# Patient Record
Sex: Female | Born: 1965 | Race: White | Hispanic: No | Marital: Married | Smoking: Never smoker
Health system: Southern US, Community
[De-identification: ages and names within clinical notes are randomized; demographics above are authoritative.]

## PROBLEM LIST (undated history)

## (undated) HISTORY — PX: AUGMENTATION MAMMAPLASTY: SUR837

---

## 2008-11-30 ENCOUNTER — Ambulatory Visit: Payer: Self-pay

## 2010-02-15 ENCOUNTER — Ambulatory Visit: Payer: Self-pay

## 2011-03-04 ENCOUNTER — Ambulatory Visit: Payer: Self-pay

## 2012-03-23 ENCOUNTER — Ambulatory Visit: Payer: Self-pay

## 2013-03-28 ENCOUNTER — Ambulatory Visit: Payer: Self-pay

## 2014-04-04 ENCOUNTER — Ambulatory Visit: Payer: Self-pay

## 2014-05-31 ENCOUNTER — Ambulatory Visit: Payer: Self-pay | Admitting: Podiatry

## 2014-08-16 ENCOUNTER — Ambulatory Visit: Payer: Self-pay | Admitting: Podiatry

## 2016-04-24 ENCOUNTER — Other Ambulatory Visit: Payer: Self-pay | Admitting: Obstetrics and Gynecology

## 2016-04-24 DIAGNOSIS — Z1231 Encounter for screening mammogram for malignant neoplasm of breast: Secondary | ICD-10-CM

## 2016-05-14 ENCOUNTER — Other Ambulatory Visit: Payer: Self-pay | Admitting: Obstetrics and Gynecology

## 2016-05-14 ENCOUNTER — Ambulatory Visit
Admission: RE | Admit: 2016-05-14 | Discharge: 2016-05-14 | Disposition: A | Discharge status: Home / Self Care | Payer: 59 | Source: Ambulatory Visit | Attending: Obstetrics and Gynecology | Admitting: Obstetrics and Gynecology

## 2016-05-14 DIAGNOSIS — R928 Other abnormal and inconclusive findings on diagnostic imaging of breast: Secondary | ICD-10-CM | POA: Diagnosis not present

## 2016-05-14 DIAGNOSIS — Z1231 Encounter for screening mammogram for malignant neoplasm of breast: Secondary | ICD-10-CM

## 2016-05-20 ENCOUNTER — Other Ambulatory Visit: Payer: Self-pay | Admitting: Obstetrics and Gynecology

## 2016-05-20 DIAGNOSIS — N6489 Other specified disorders of breast: Secondary | ICD-10-CM

## 2016-06-06 ENCOUNTER — Other Ambulatory Visit: Payer: 59

## 2016-06-06 ENCOUNTER — Ambulatory Visit: Payer: 59

## 2016-06-20 ENCOUNTER — Other Ambulatory Visit: Payer: Self-pay | Admitting: Obstetrics and Gynecology

## 2016-06-20 ENCOUNTER — Ambulatory Visit
Admission: RE | Admit: 2016-06-20 | Discharge: 2016-06-20 | Disposition: A | Discharge status: Home / Self Care | Payer: 59 | Source: Ambulatory Visit | Attending: Obstetrics and Gynecology | Admitting: Obstetrics and Gynecology

## 2016-06-20 DIAGNOSIS — N6489 Other specified disorders of breast: Secondary | ICD-10-CM

## 2016-06-20 DIAGNOSIS — N63 Unspecified lump in breast: Secondary | ICD-10-CM | POA: Insufficient documentation

## 2016-06-25 ENCOUNTER — Other Ambulatory Visit: Payer: Self-pay | Admitting: Obstetrics and Gynecology

## 2016-06-25 DIAGNOSIS — N6489 Other specified disorders of breast: Secondary | ICD-10-CM

## 2016-12-22 ENCOUNTER — Ambulatory Visit
Admission: RE | Admit: 2016-12-22 | Discharge: 2016-12-22 | Disposition: A | Discharge status: Home / Self Care | Payer: 59 | Source: Ambulatory Visit | Attending: Obstetrics and Gynecology | Admitting: Obstetrics and Gynecology

## 2016-12-22 DIAGNOSIS — N6489 Other specified disorders of breast: Secondary | ICD-10-CM | POA: Diagnosis present

## 2016-12-25 ENCOUNTER — Other Ambulatory Visit: Payer: Self-pay | Admitting: Obstetrics and Gynecology

## 2016-12-25 DIAGNOSIS — N6320 Unspecified lump in the left breast, unspecified quadrant: Secondary | ICD-10-CM

## 2017-01-09 IMAGING — US US BREAST*L* LIMITED INC AXILLA
1 series · 13 of 13 positions shown · non-contrast
Comparison: Previous exam(s).

CLINICAL DATA: The patient was called back from screening
mammography due to a left breast asymmetry

EXAM:
2D DIGITAL DIAGNOSTIC LEFT MAMMOGRAM WITH ADJUNCT TOMO
ULTRASOUND LEFT BREAST

[Series 1: us breast*left* limited inc axilla · 0.06mm/px · 13 of 13 slices shown]
[im 1/13]
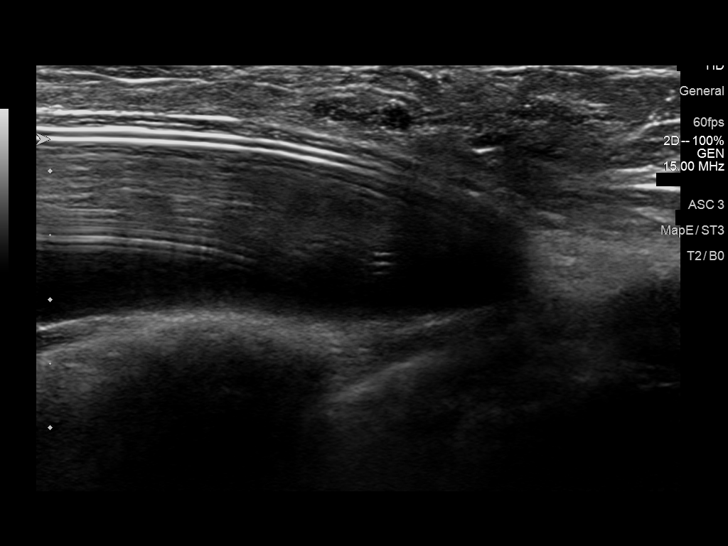
[im 2/13]
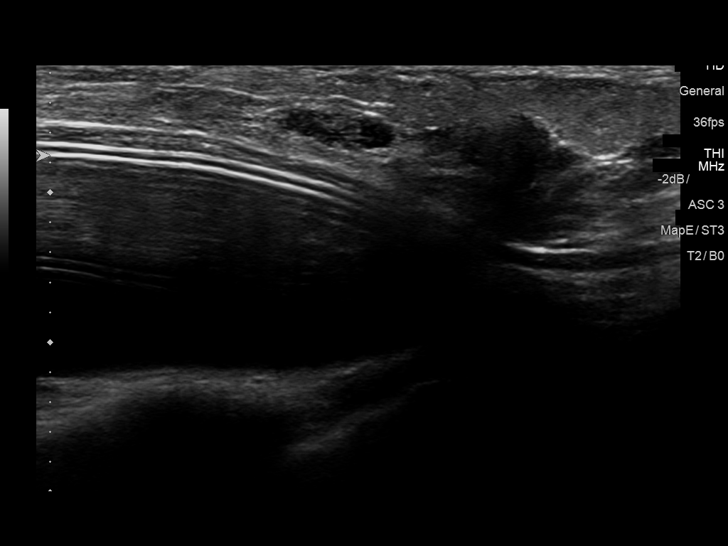
[im 3/13]
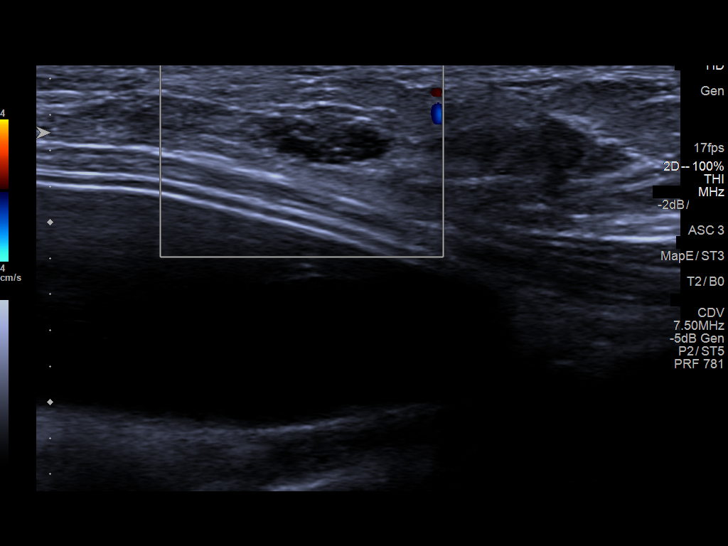
[im 4/13]
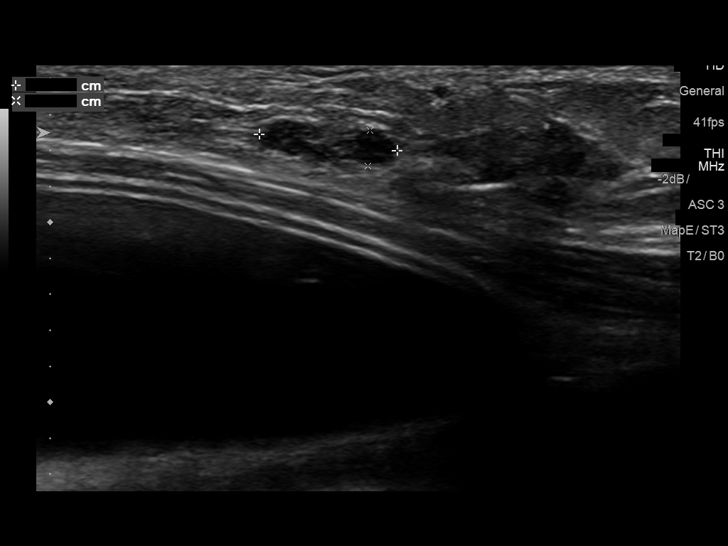
[im 5/13]
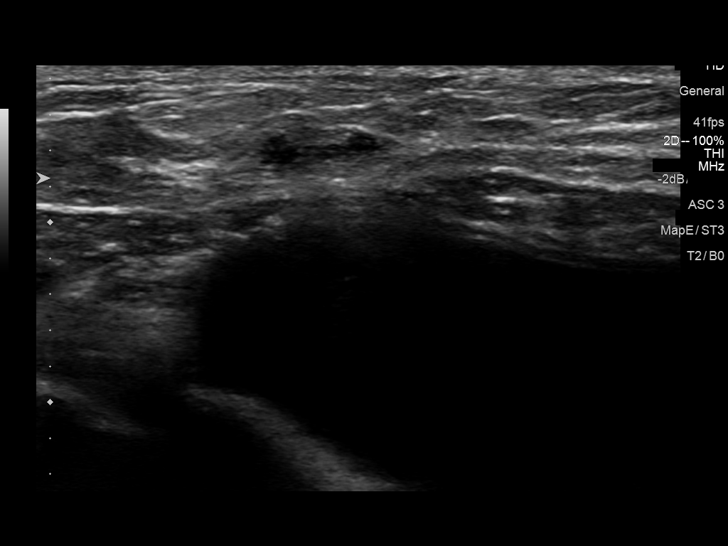
[im 6/13]
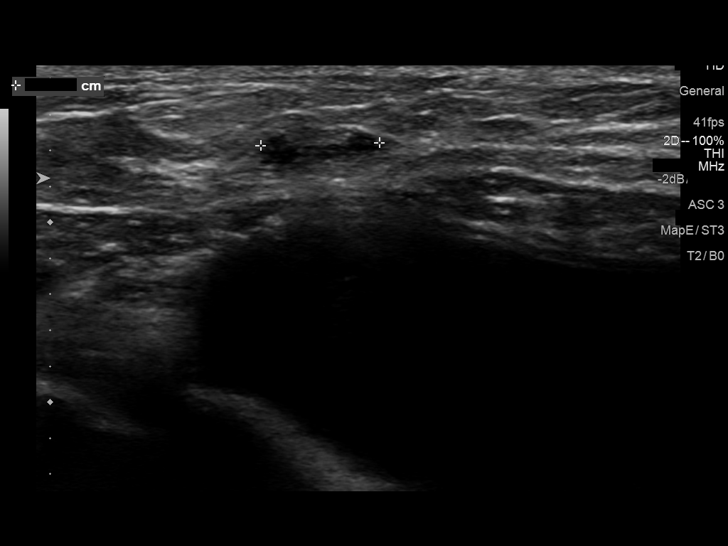
[im 7/13]
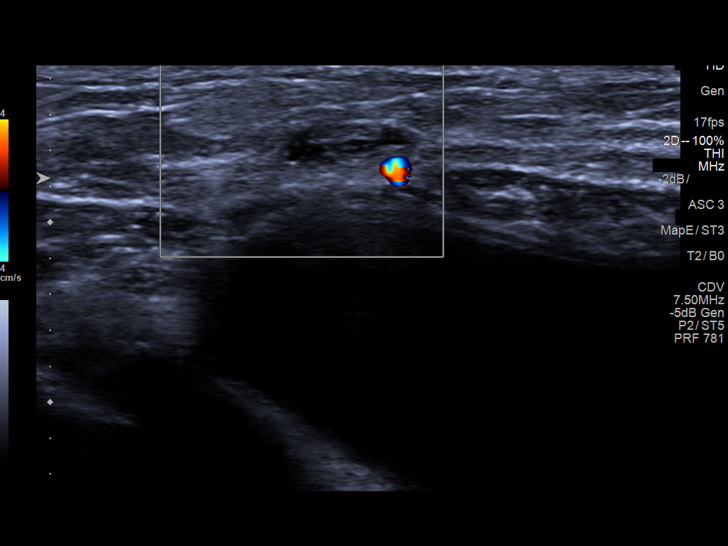
[im 8/13]
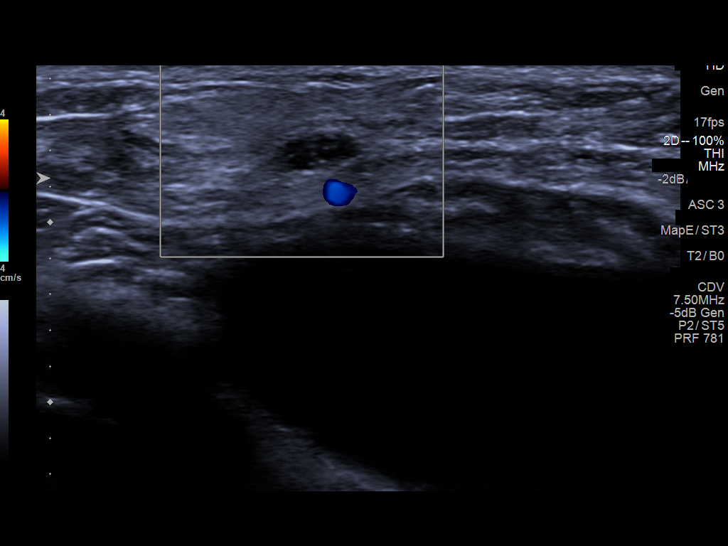
[im 9/13]
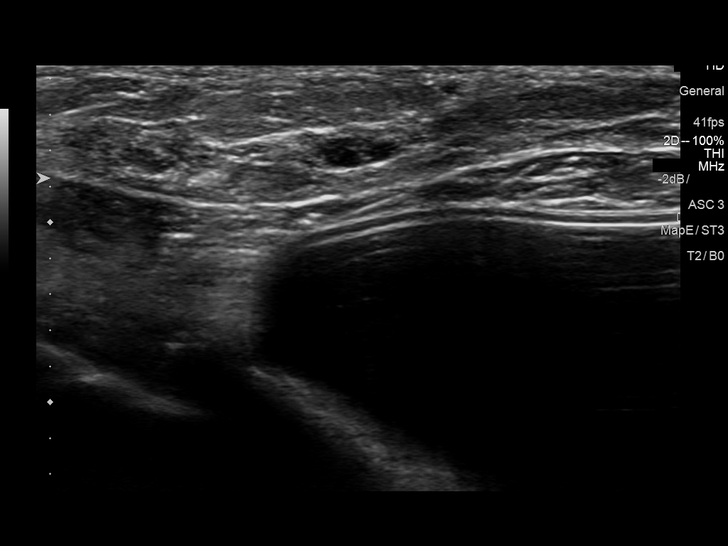
[im 10/13]
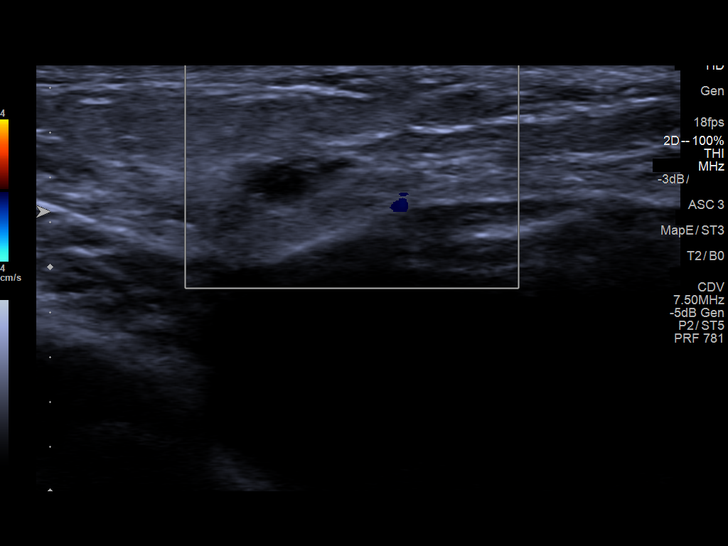
[im 11/13]
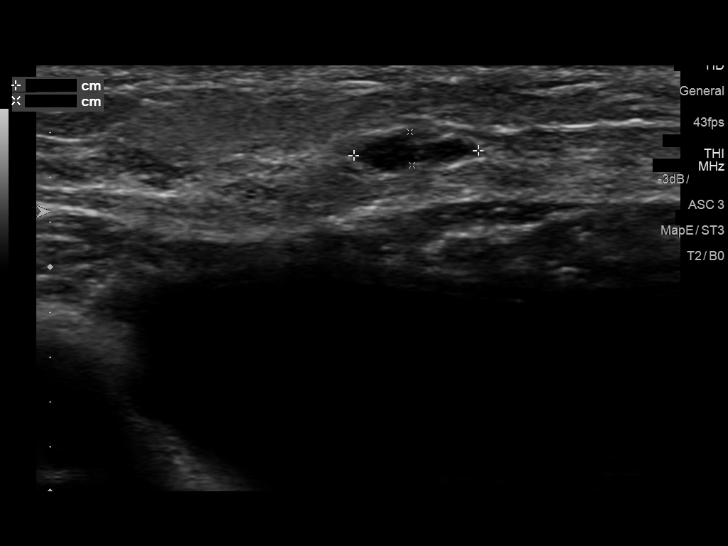
[im 12/13]
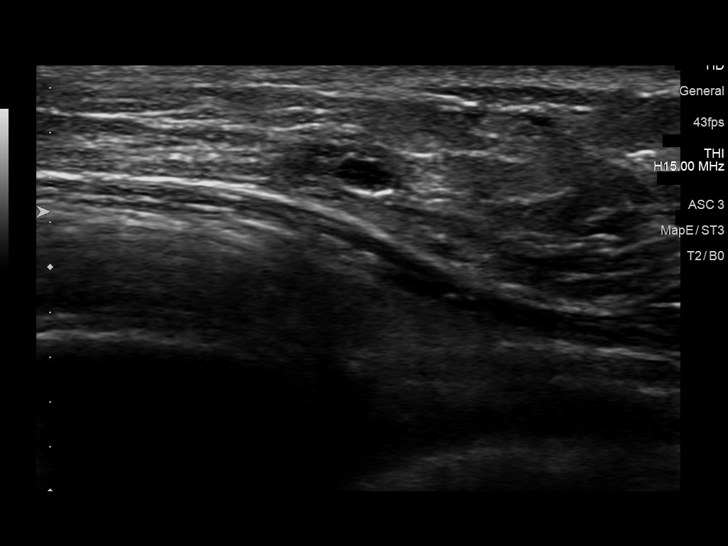
[im 13/13]
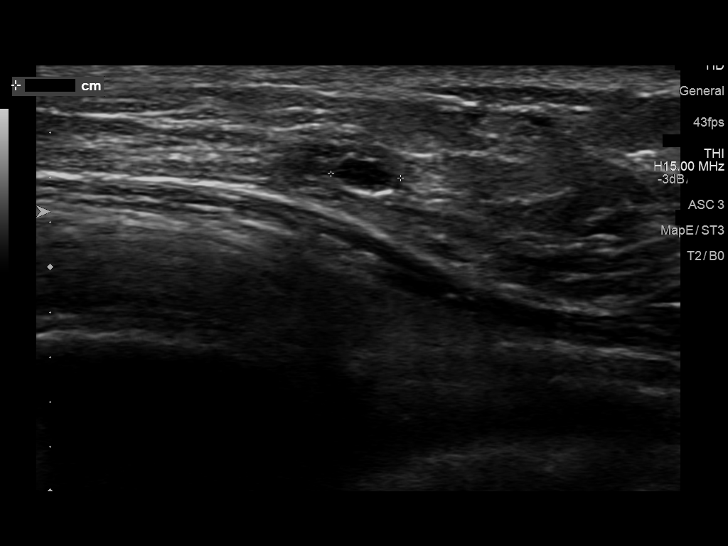

[13 of 13 positions shown; findings below may reference images not displayed]

ACR Breast Density Category c: The breast tissue is heterogeneously
dense, which may obscure small masses.
FINDINGS: The left breast asymmetry resolves on additional imaging. No
suspicious masses mammographically.

On physical exam, no suspicious masses are felt.

Targeted ultrasound is performed, showing two masses in the medial
left breast. The first is seen at [DATE], 4 cm from the nipple
measuring 3 x 6 x 2 mm. The other is seen at [DATE], 2 cm from the
nipple measuring 7 x 8 x 2 mm. Both appear to be clusters of cysts.
IMPRESSION: Probably benign masses in the medial left breast, likely clusters of
cysts, only seen sonographically.

RECOMMENDATION:
Six-month follow-up ultrasound to ensure stability of the probably
benign masses, likely clusters of cysts.

I have discussed the findings and recommendations with the patient.
Results were also provided in writing at the conclusion of the
visit. If applicable, a reminder letter will be sent to the patient
regarding the next appointment.

BI-RADS CATEGORY  3: Probably benign.

## 2017-06-25 ENCOUNTER — Ambulatory Visit
Admission: RE | Admit: 2017-06-25 | Discharge: 2017-06-25 | Disposition: A | Discharge status: Home / Self Care | Payer: 59 | Source: Ambulatory Visit | Attending: Obstetrics and Gynecology | Admitting: Obstetrics and Gynecology

## 2017-06-25 DIAGNOSIS — N6320 Unspecified lump in the left breast, unspecified quadrant: Secondary | ICD-10-CM

## 2017-06-25 DIAGNOSIS — Z9882 Breast implant status: Secondary | ICD-10-CM | POA: Diagnosis not present

## 2017-06-25 DIAGNOSIS — N6002 Solitary cyst of left breast: Secondary | ICD-10-CM | POA: Insufficient documentation

## 2017-07-13 IMAGING — US US BREAST*L* LIMITED INC AXILLA
1 series · 13 of 22 positions shown · non-contrast
Comparison: Previous exam(s).

CLINICAL DATA: Patient presents for a six-month follow-up of 2
probably benign small mass is noted in the left breast from the
diagnostic study dated 06/20/2016.

EXAM:
ULTRASOUND OF THE LEFT BREAST

[Series 1: us breast*left* limited inc axilla · 0.03mm/px · 13 of 22 slices shown]
[im 1/22]
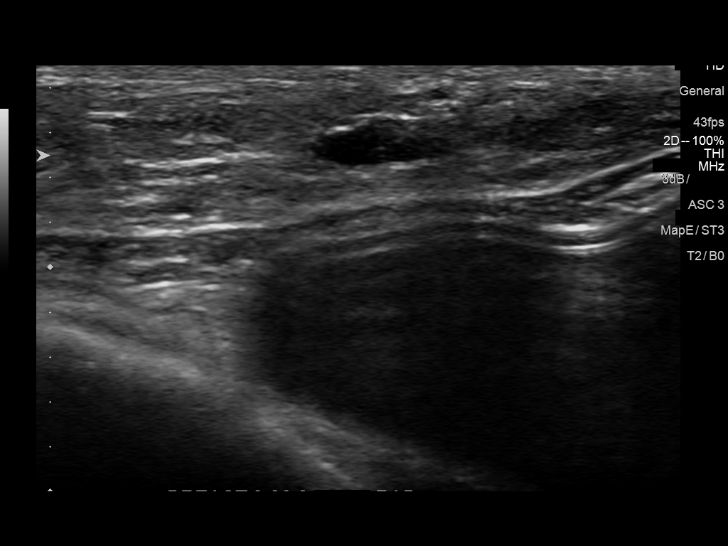
[im 3/22]
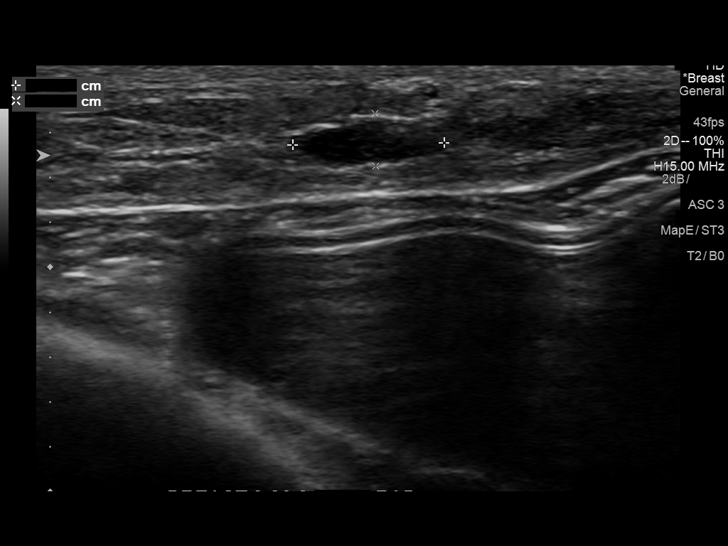
[im 5/22]
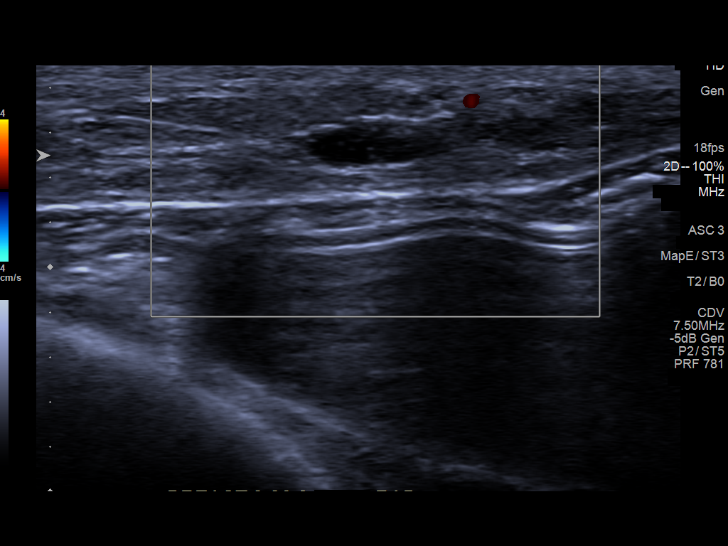
[im 6/22]
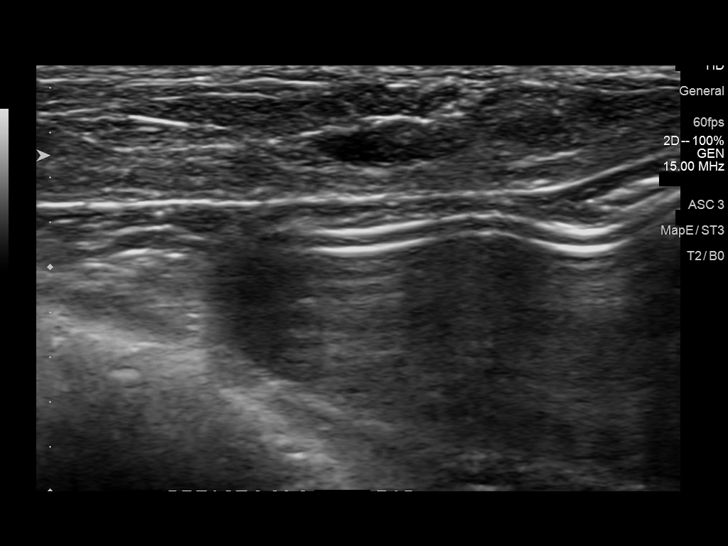
[im 8/22]
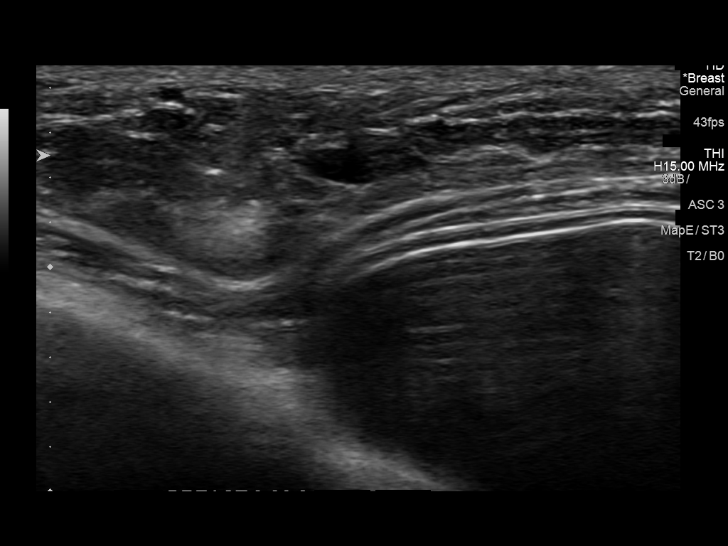
[im 10/22]
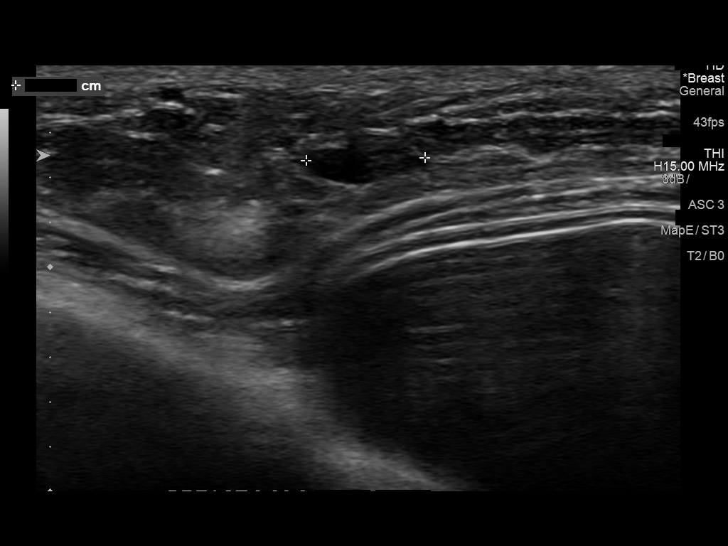
[im 12/22]
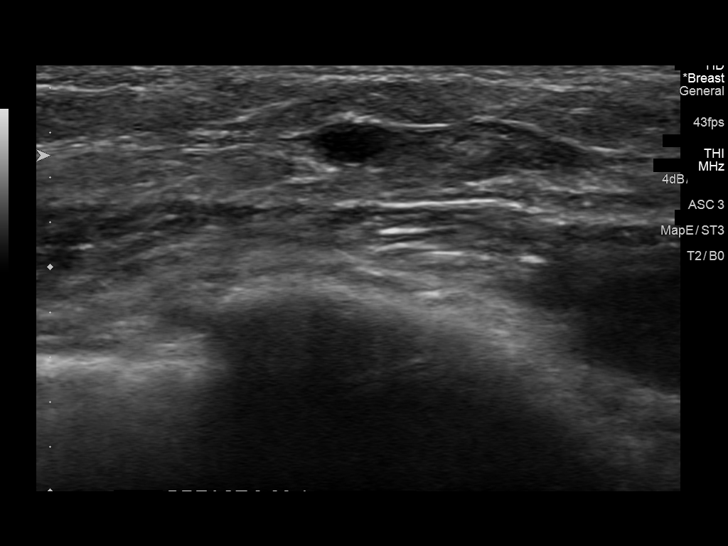
[im 13/22]
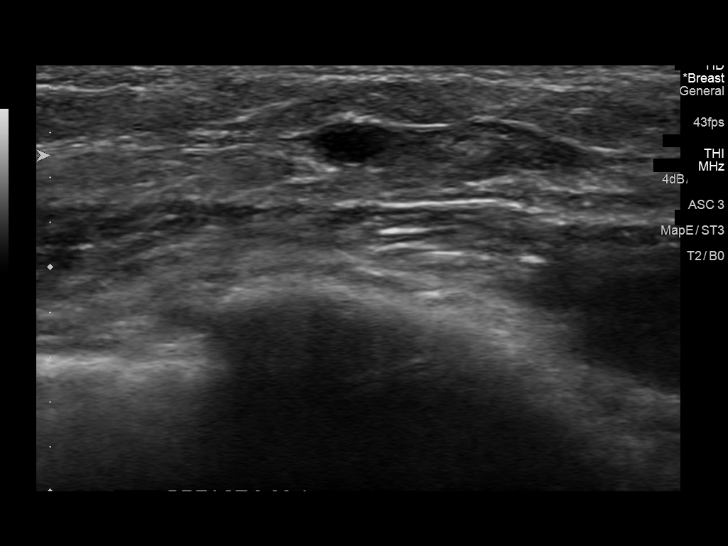
[im 15/22]
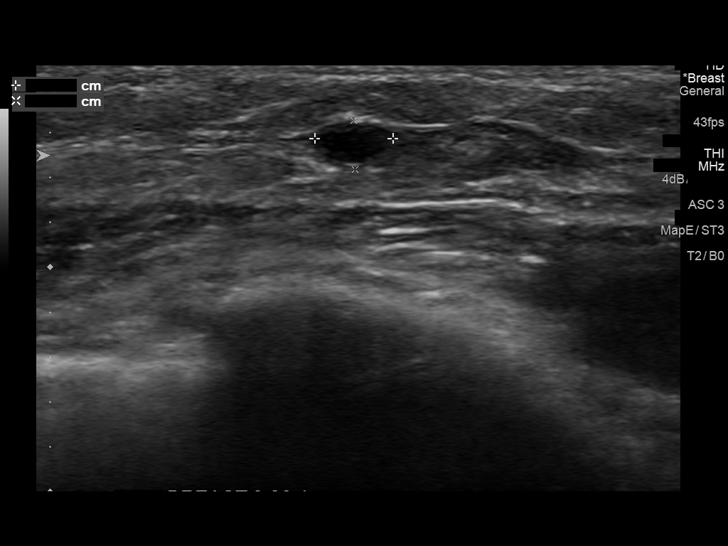
[im 17/22]
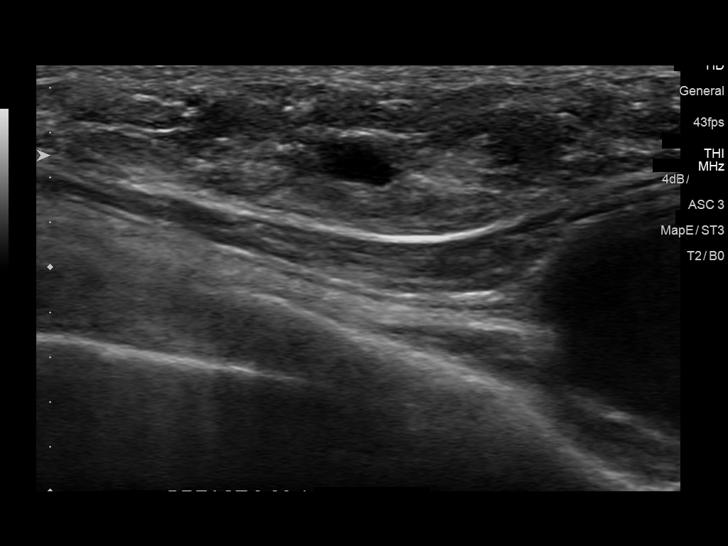
[im 18/22]
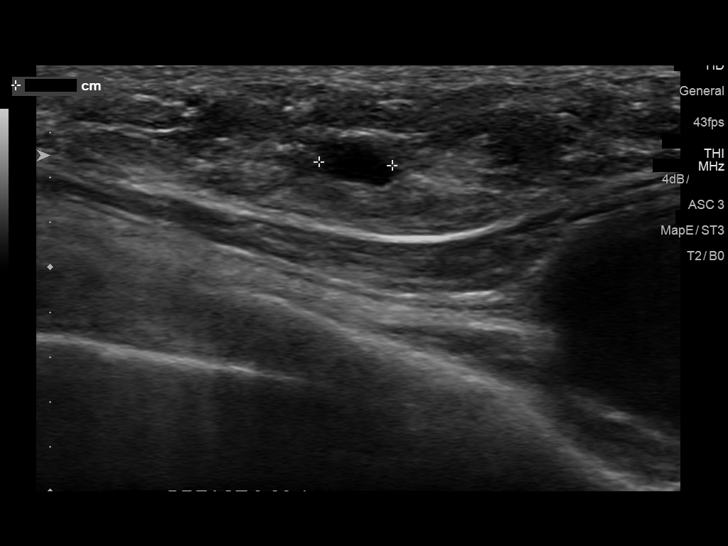
[im 20/22]
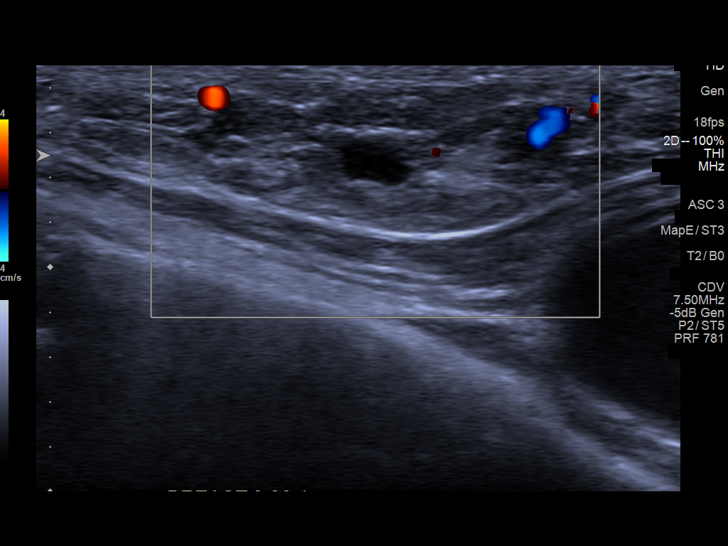
[im 22/22]
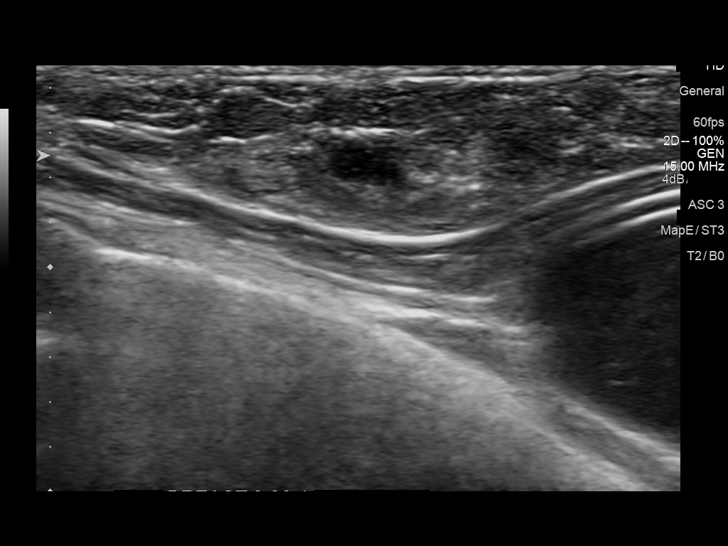

[13 of 22 positions shown; findings below may reference images not displayed]

FINDINGS: Targeted ultrasound is performed, showing the previously identified
to small masses. In the 9:30 o'clock position, 2 cm the nipple,
there is an oval hypoechoic mass that currently measures 5 x 2 x 4
mm, previously 8 x 2 x 7 mm. In the 9:30 o'clock position, 4 cm from
nipple, there is an oval hypoechoic to anechoic circumscribed mass
measuring 4 x 2 x 3 mm, previously 6 x 2 x 3 mm.
IMPRESSION: Probably benign left breast masses, which measure smaller on the
current exam compared to the exam from June 2016. These are
almost certainly benign. One additional short-term follow-up
recommended at which time the patient will be due for annual
mammography.

RECOMMENDATION:
Diagnostic mammography and left breast ultrasound in 6 months.

I have discussed the findings and recommendations with the patient.
Results were also provided in writing at the conclusion of the
visit. If applicable, a reminder letter will be sent to the patient
regarding the next appointment.

BI-RADS CATEGORY  3: Probably benign.

## 2018-01-14 IMAGING — US US BREAST*L* LIMITED INC AXILLA
1 series · 10 of 10 positions shown · non-contrast
Comparison: Previous exam(s).

CLINICAL DATA: Follow-up for probably benign masses within the left
breast, suspected cysts, originally identified on screening
mammogram of 05/14/2016.

EXAM:
2D DIGITAL DIAGNOSTIC BILATERAL MAMMOGRAM WITH IMPLANTS, CAD AND
ADJUNCT TOMO
ULTRASOUND LEFT BREAST
The patient has prepectoral implants. Standard and implant displaced
views were performed.

[Series 1: us breast*left* limited inc axilla · 0.03mm/px · 10 of 10 slices shown]
[im 1/10]
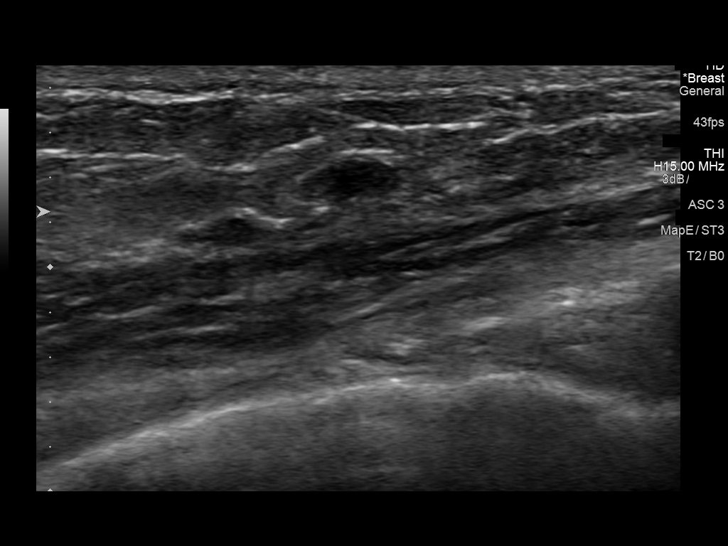
[im 2/10]
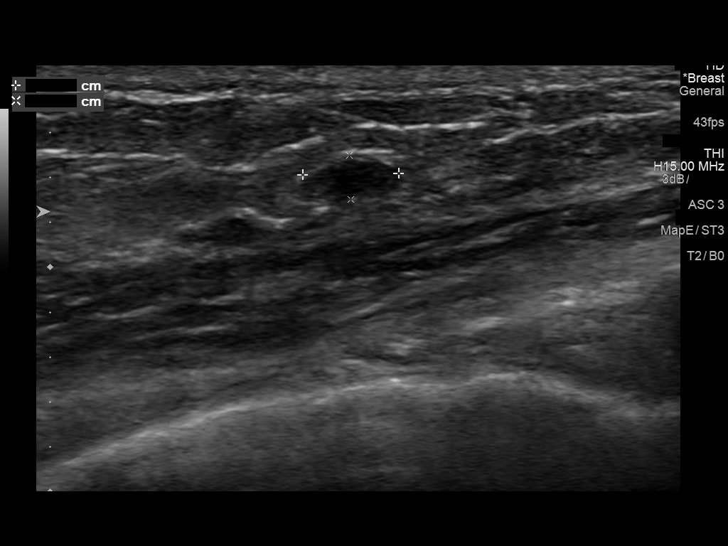
[im 3/10]
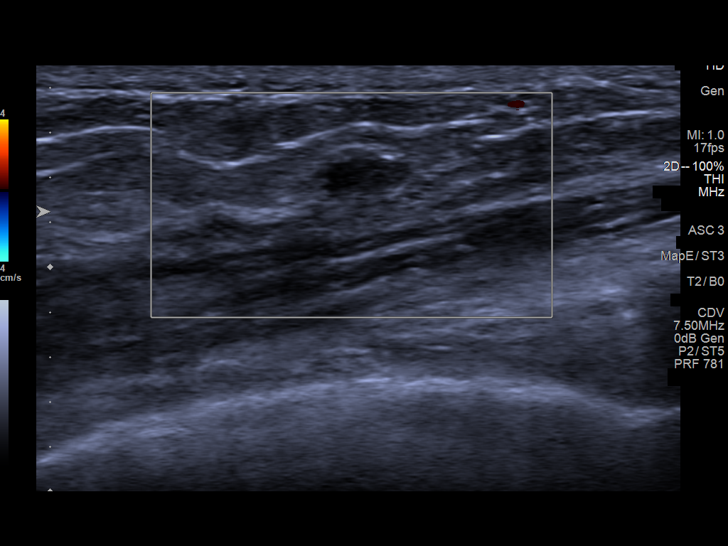
[im 4/10]
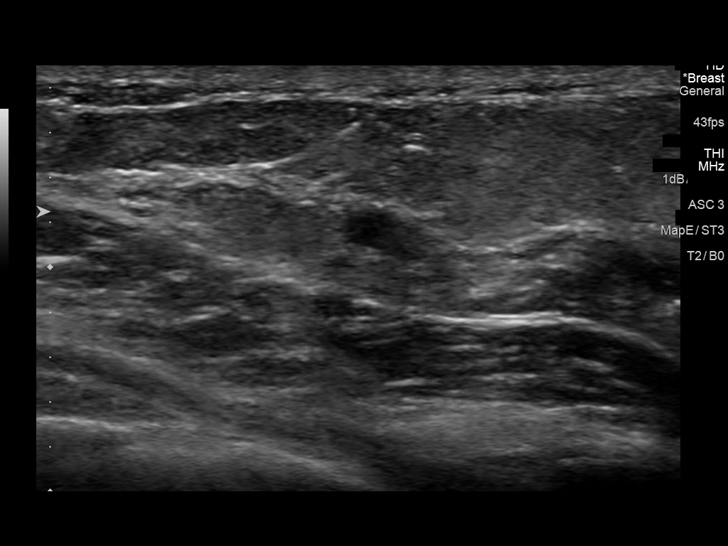
[im 5/10]
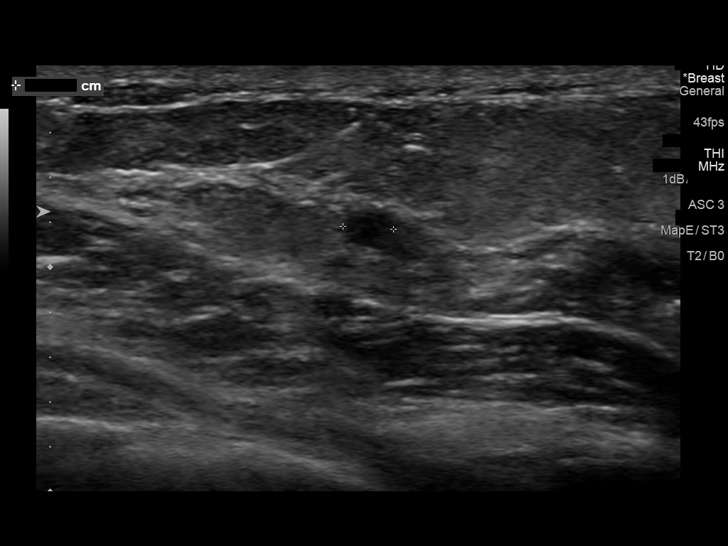
[im 6/10]
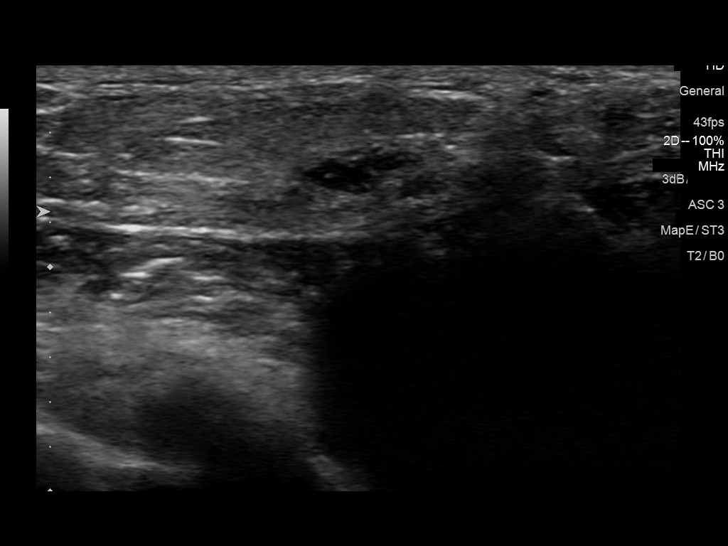
[im 7/10]
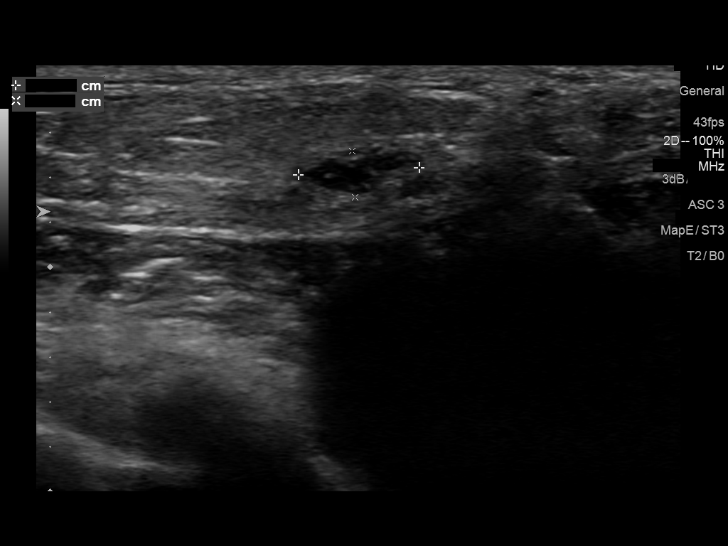
[im 8/10]
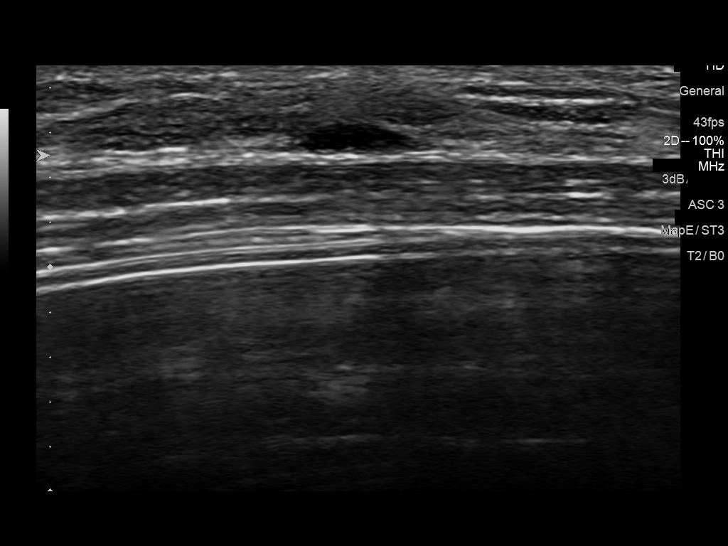
[im 9/10]
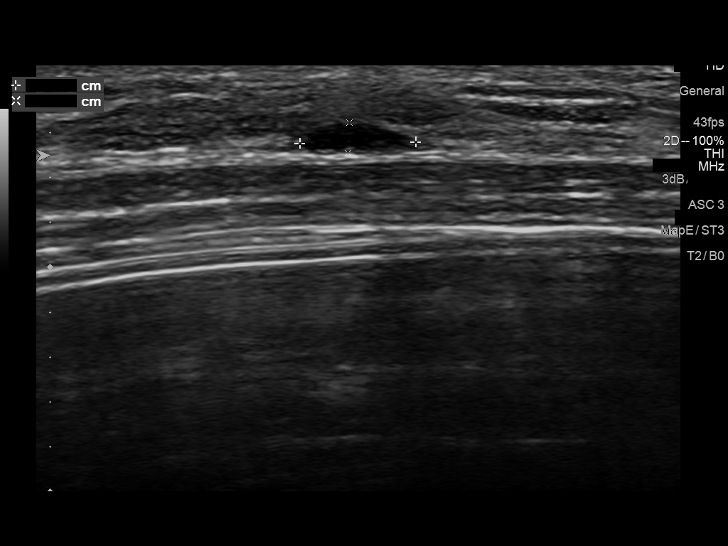
[im 10/10]
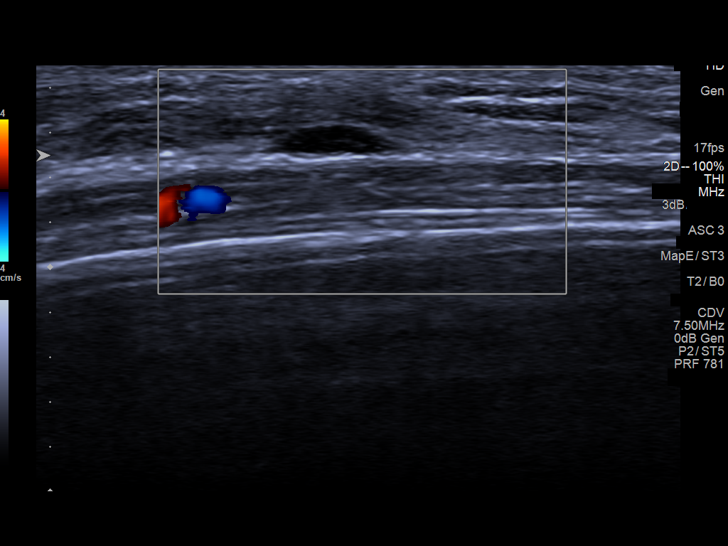

[10 of 10 positions shown; findings below may reference images not displayed]

ACR Breast Density Category c: The breast tissue is heterogeneously
dense, which may obscure small masses.
FINDINGS: There are no dominant masses, suspicious calcifications or secondary
signs of malignancy identified within either breast. Previously
described masses within the inferior left breast are not seen on
today's mammograms.

Targeted ultrasound is performed, showing 2 benign cysts within the
left breast at the 9:30 o'clock axis, 2 cm from the nipple and 4 cm
from the nipple respectively. The largest cyst has slightly
decreased in size compared to the previous exam, now measuring 5 mm
(measured 8 mm on earliest ultrasound of 06/20/2016) confirming
benignity. The smaller cyst is also slightly decreased in size
compared to the earliest ultrasound indicating benignity.

No suspicious solid or cystic mass is identified.

Mammographic images were processed with CAD.
IMPRESSION: No evidence of malignancy within either breast. Benign cysts within
the left breast.

Patient may return to routine annual bilateral screening mammogram
schedule.

RECOMMENDATION:
Screening mammogram in one year.(Code:[id number]-[id number])

I have discussed the findings and recommendations with the patient.
Results were also provided in writing at the conclusion of the
visit. If applicable, a reminder letter will be sent to the patient
regarding the next appointment.

BI-RADS CATEGORY  2: Benign.

## 2018-06-25 ENCOUNTER — Other Ambulatory Visit: Payer: Self-pay | Admitting: Obstetrics and Gynecology

## 2018-06-25 DIAGNOSIS — Z1231 Encounter for screening mammogram for malignant neoplasm of breast: Secondary | ICD-10-CM

## 2018-07-14 ENCOUNTER — Encounter (INDEPENDENT_AMBULATORY_CARE_PROVIDER_SITE_OTHER): Payer: Self-pay

## 2018-07-14 ENCOUNTER — Ambulatory Visit
Admission: RE | Admit: 2018-07-14 | Discharge: 2018-07-14 | Disposition: A | Discharge status: Home / Self Care | Payer: BLUE CROSS/BLUE SHIELD | Source: Ambulatory Visit | Attending: Obstetrics and Gynecology | Admitting: Obstetrics and Gynecology

## 2018-07-14 DIAGNOSIS — Z1231 Encounter for screening mammogram for malignant neoplasm of breast: Secondary | ICD-10-CM | POA: Diagnosis present

## 2020-12-09 ENCOUNTER — Ambulatory Visit
Admission: EM | Admit: 2020-12-09 | Discharge: 2020-12-09 | Disposition: A | Discharge status: Home / Self Care | Payer: 59 | Attending: Emergency Medicine | Admitting: Emergency Medicine

## 2020-12-09 ENCOUNTER — Other Ambulatory Visit: Payer: Self-pay

## 2020-12-09 ENCOUNTER — Encounter: Payer: Self-pay | Admitting: Emergency Medicine

## 2020-12-09 DIAGNOSIS — H66002 Acute suppurative otitis media without spontaneous rupture of ear drum, left ear: Secondary | ICD-10-CM

## 2020-12-09 DIAGNOSIS — J069 Acute upper respiratory infection, unspecified: Secondary | ICD-10-CM | POA: Diagnosis not present

## 2020-12-09 DIAGNOSIS — R519 Headache, unspecified: Secondary | ICD-10-CM

## 2020-12-09 MED ORDER — TRAMADOL HCL 50 MG PO TABS: 50.0000 mg | ORAL_TABLET | Freq: Four times a day (QID) | ORAL | 0 refills | Status: AC | PRN

## 2020-12-09 MED ORDER — DOXYCYCLINE HYCLATE 100 MG PO CAPS: 100.0000 mg | ORAL_CAPSULE | Freq: Two times a day (BID) | ORAL | 0 refills | Status: AC

## 2020-12-09 NOTE — ED Triage Notes (Addendum)
Patient c/o headache and sinus pressure and congestion for a week.  Patient denies fevers or cough.  Patient declined a COVID test today.

## 2020-12-09 NOTE — ED Provider Notes (Signed)
[facility]    CSN: [id number] Arrival date & time: 12/09/20  1015      History   Chief Complaint Chief Complaint  Patient presents with  . Headache  . Sinus Problem    HPI Alexis Casey is a 55 y.o. female.   HPI   55 year old female here for evaluation of upper respiratory symptoms.  Patient reports that she has been experiencing sinus pain and pressure with a left-sided headache for the past week.  Patient reports that she started to have decreased hearing in her left ear as well now.  Patient reports that she is able to get some yellow discharge out of her nasal passages but not frequently.  Patient denies fever, pain in her teeth, or lower respiratory symptoms.  Patient is not vaccinating his COVID but is declining testing today.  Patient is not aware of any known Covid exposure.  History reviewed. No pertinent past medical history.  There are no problems to display for this patient.   Past Surgical History:  Procedure Laterality Date  . AUGMENTATION MAMMAPLASTY Bilateral 10 years ago    OB History   No obstetric history on file.      Home Medications    Prior to Admission medications   Medication Sig Start Date End Date Taking? Authorizing Provider  doxycycline (VIBRAMYCIN) 100 MG capsule Take 1 capsule (100 mg total) by mouth 2 (two) times daily. 12/09/20  Yes Margarette Canada, NP  traMADol (ULTRAM) 50 MG tablet Take 1 tablet (50 mg total) by mouth every 6 (six) hours as needed. 12/09/20  Yes Margarette Canada, NP    Family History Family History  Problem Relation Age of Onset  . Breast cancer Mother 36    Social History Social History   Tobacco Use  . Smoking status: Never Smoker  . Smokeless tobacco: Never Used  Vaping Use  . Vaping Use: Never used  Substance Use Topics  . Alcohol use: Yes  . Drug use: Never     Allergies   Penicillins   Review of Systems Review of Systems  Constitutional: Negative for fever.  HENT: Positive for  congestion, ear pain, sinus pressure and sinus pain. Negative for ear discharge and sore throat.   Respiratory: Negative for cough, shortness of breath and wheezing.   Musculoskeletal: Negative for arthralgias and myalgias.  Skin: Negative for rash.  Neurological: Positive for headaches.  Hematological: Negative.   Psychiatric/Behavioral: Negative.      Physical Exam Triage Vital Signs ED Triage Vitals  Enc Vitals Group     BP 12/09/20 1024 123/75     Pulse Rate 12/09/20 1024 71     Resp 12/09/20 1024 14     Temp 12/09/20 1024 98.2 F (36.8 C)     Temp Source 12/09/20 1024 Oral     SpO2 12/09/20 1024 100 %     Weight 12/09/20 1020 130 lb (59 kg)     Height 12/09/20 1020 _0  (1.651 m)     Head Circumference --      Peak Flow --      Pain Score 12/09/20 1020 10     Pain Loc --      Pain Edu? --      Excl. in [place]? --    No data found.  Updated Vital Signs BP 123/75 (BP Location: Right Arm)   Pulse 71   Temp 98.2 F (36.8 C) (Oral)   Resp 14   Ht _1  (1.651 m)  Wt 130 lb (59 kg)   SpO2 100%   BMI 21.63 kg/m   Visual Acuity Right Eye Distance:   Left Eye Distance:   Bilateral Distance:    Right Eye Near:   Left Eye Near:    Bilateral Near:     Physical Exam Vitals and nursing note reviewed.  Constitutional:      General: She is not in acute distress.    Appearance: Normal appearance.  HENT:     Head: Normocephalic and atraumatic.     Right Ear: Tympanic membrane, ear canal and external ear normal.     Left Ear: Ear canal and external ear normal.     Nose: Congestion present.     Mouth/Throat:     Mouth: Mucous membranes are moist.     Pharynx: Oropharynx is clear. No posterior oropharyngeal erythema.  Cardiovascular:     Rate and Rhythm: Normal rate and regular rhythm.     Pulses: Normal pulses.     Heart sounds: Normal heart sounds. No murmur heard.   Pulmonary:     Effort: Pulmonary effort is normal.     Breath sounds: Normal breath sounds.  No wheezing, rhonchi or rales.  Musculoskeletal:     Cervical back: Normal range of motion and neck supple.  Lymphadenopathy:     Cervical: No cervical adenopathy.  Skin:    General: Skin is warm and dry.     Capillary Refill: Capillary refill takes less than 2 seconds.     Findings: No erythema or rash.  Neurological:     General: No focal deficit present.     Mental Status: She is alert and oriented to person, place, and time.  Psychiatric:        Mood and Affect: Mood normal.        Behavior: Behavior normal.        Thought Content: Thought content normal.        Judgment: Judgment normal.      UC Treatments / Results  Labs (all labs ordered are listed, but only abnormal results are displayed) Labs Reviewed - No data to display  EKG   Radiology No results found.  Procedures Procedures (including critical care time)  Medications Ordered in UC Medications - No data to display  Initial Impression / Assessment and Plan / UC Course  I have reviewed the triage vital signs and the nursing notes.  Pertinent labs & imaging results that were available during my care of the patient were reviewed by me and considered in my medical decision making (see chart for details).   Patient is a very pleasant 55 year old female here for evaluation of sinus symptoms that started a week ago.  She states she has having some yellow discharge on occasion but mostly a lot of facial pressure and left ear pressure in conjunction with a headache.  Patient has used over-the-counter NSAIDs without any relief of her headache.  Physical exam reveals an injected and erythematous left tympanic membrane with a loss of landmarks.  External auditory canal on the left is clear.  Right TM and EAC are normal in appearance.  Nasal mucosa is erythematous and edematous.  No discharge noted in either nare.  Patient has no tenderness to percussion of her maxillary or frontal sinuses.  Posterior oropharynx is clear.   No cervical lymphadenopathy on exam.  Patient's lungs are clear to auscultation all fields.  Patient's physical exam is consistent with an upper respiratory infection and left otitis media.  Will treat left otitis media with doxycycline twice daily for 10 days as patient has a penicillin allergy and she is unsure if she is ever had a cephalosporin.  We will also recommend sinus irrigation, Mucinex, increase fluids, and will give tramadol for headache as OTC NSAIDs are not helping.   Final Clinical Impressions(s) / UC Diagnoses   Final diagnoses:  Upper respiratory tract infection, unspecified type  Non-recurrent acute suppurative otitis media of left ear without spontaneous rupture of tympanic membrane  Acute nonintractable headache, unspecified headache type     Discharge Instructions     The Doxycycline twice daily with food for 10 days for treatment of your sinusitis.  Perform sinus irrigation 2-3 times a day with a NeilMed sinus rinse kit and distilled water.  Do not use tap water.  You can use plain over-the-counter Mucinex every 6 hours to break up the stickiness of the mucus so your body can clear it.  Increase your oral fluid intake to thin out your mucus so that is also able for your body to clear more easily.  Take an over-the-counter probiotic, such as Culturelle-align-activia, 1 hour after each dose of antibiotic to prevent diarrhea.  If you develop any new or worsening symptoms return for reevaluation or see your primary care provider.     ED Prescriptions    Medication Sig Dispense Auth. Provider   doxycycline (VIBRAMYCIN) 100 MG capsule Take 1 capsule (100 mg total) by mouth 2 (two) times daily. 20 capsule Margarette Canada, NP   traMADol (ULTRAM) 50 MG tablet Take 1 tablet (50 mg total) by mouth every 6 (six) hours as needed. 15 tablet Margarette Canada, NP     I have reviewed the PDMP during this encounter.   Margarette Canada, NP 12/09/20 1059

## 2020-12-09 NOTE — Discharge Instructions (Addendum)
The Doxycycline twice daily with food for 10 days for treatment of your sinusitis.  Perform sinus irrigation 2-3 times a day with a NeilMed sinus rinse kit and distilled water.  Do not use tap water.  You can use plain over-the-counter Mucinex every 6 hours to break up the stickiness of the mucus so your body can clear it.  Increase your oral fluid intake to thin out your mucus so that is also able for your body to clear more easily.  Take an over-the-counter probiotic, such as Culturelle-align-activia, 1 hour after each dose of antibiotic to prevent diarrhea.  If you develop any new or worsening symptoms return for reevaluation or see your primary care provider.  

## 2021-08-16 ENCOUNTER — Other Ambulatory Visit: Payer: Self-pay

## 2022-03-28 ENCOUNTER — Other Ambulatory Visit: Payer: Self-pay | Admitting: Infectious Diseases

## 2022-03-28 DIAGNOSIS — Z1231 Encounter for screening mammogram for malignant neoplasm of breast: Secondary | ICD-10-CM

## 2022-05-13 LAB — EXTERNAL GENERIC LAB PROCEDURE: COLOGUARD: NEGATIVE

## 2023-01-14 ENCOUNTER — Ambulatory Visit: Payer: 59 | Attending: Physician Assistant | Admitting: Physical Therapy

## 2023-01-14 DIAGNOSIS — M25521 Pain in right elbow: Secondary | ICD-10-CM | POA: Insufficient documentation

## 2023-01-14 DIAGNOSIS — M7711 Lateral epicondylitis, right elbow: Secondary | ICD-10-CM | POA: Insufficient documentation

## 2023-01-14 DIAGNOSIS — M6281 Muscle weakness (generalized): Secondary | ICD-10-CM | POA: Diagnosis present

## 2023-01-17 ENCOUNTER — Encounter: Payer: Self-pay | Admitting: Physical Therapy

## 2023-01-17 NOTE — Therapy (Addendum)
OUTPATIENT PHYSICAL THERAPY ELBOW EVALUATION   Patient Name: Alexis Casey MRN: [id number] DOB:1966/03/19, 57 y.o., female Today's Date: 01/14/23  END OF SESSION:  PT End of Session - 01/17/23 1927     Visit Number 1    Number of Visits 8    Date for PT Re-Evaluation 02/11/23    PT Start Time 1255    PT Stop Time 1350    PT Time Calculation (min) 55 min             Past Surgical History:  Procedure Laterality Date   AUGMENTATION MAMMAPLASTY Bilateral 10 years ago   REFERRING PROVIDER: Tera Partridge, PA  REFERRING DIAG: Lateral epicondylitis of right elbow  THERAPY DIAG:  Right lateral epicondylitis  Pain in right elbow  Muscle weakness (generalized)  Rationale for Evaluation and Treatment: Rehabilitation  ONSET DATE: chronic (>6 months ago).    SUBJECTIVE:                                                                                                                                                                                      SUBJECTIVE STATEMENT: Pt. Reports persistent R elbow pain in morning and during repetitive tasks.  Pt. Received cortisone injection 6 months ago and pain continues to persist.  Pt. States she sleeps with R elbow full extended at night.  Pt. States she has difficulty bending R elbow in the morning.    Hand dominance: Right  PERTINENT HISTORY: See MD note.  Pt. Well known to PT clinic.    PAIN:  Are you having pain? Yes: NPRS scale: 3/10 Pain location: R lateral elbow/distal tricep Pain description: aching Aggravating factors: elbow flexion in morning Relieving factors: rest  PRECAUTIONS: None  WEIGHT BEARING RESTRICTIONS: No  FALLS:  Has patient fallen in last 6 months? 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 R distal bicep tightness. L shoulder WNL/ elbow 0 to 160 deg.   UPPER EXTREMITY MMT:  MMT Right eval Left eval  Shoulder flexion 5 5  Shoulder extension    Shoulder abduction 5 5  Shoulder adduction    Shoulder internal rotation 5 5  Shoulder external rotation 5 5  Middle trapezius    Lower trapezius    Elbow flexion 4+ 5  Elbow extension 4 5  Wrist flexion    Wrist extension    Wrist ulnar deviation    Wrist radial deviation    Wrist pronation    Wrist supination    Grip strength (lbs) 37# 60#  (Blank rows = not tested)  SHOULDER SPECIAL TESTS: Impingement tests: Neer impingement test: negative and Hawkins/Kennedy impingement test: negative  JOINT MOBILITY TESTING:  Good R shoulder/ elbow joint mob.  PALPATION:  (+) R lat. Elbow/ distal tricep tenderness.   TODAY'S TREATMENT:                                                                                                                                         DATE: 01/14/23  Evaluation/ See HEP  PATIENT EDUCATION: Education details: Access Code: [email or web address] Person educated: Patient Education method: Explanation, Demonstration, and Handouts Education comprehension: verbalized understanding and returned demonstration  HOME EXERCISE PROGRAM: Access Code: [email or web address] URL: [email or web address] Date: 01/14/2023 Prepared by: Dorene Grebe  Exercises - Standing Wrist Extension Stretch  - 2 x daily - 7 x weekly - 1 sets - 3 reps - 20 seconds hold - Standing Wrist Flexion Stretch  - 2 x daily - 7 x weekly - 1 sets - 3 reps - 20 seconds hold - Eccentric Wrist Extension with Resistance  - 1 x daily - 7 x weekly - 1 sets - 20  reps - Seated Wrist Flexion with Resistance  - 1 x daily - 7 x weekly - 1 sets - 20 reps - Forearm Supination with Resistance  - 1 x daily - 7 x weekly - 1 sets - 20 reps - Forearm Pronation with Resistance  - 1 x daily - 7 x weekly - 1 sets - 20 reps  ASSESSMENT:  CLINICAL IMPRESSION: Patient is a pleasant 57 y.o. female who was seen today for physical therapy evaluation and treatment for R elbow pain/ lateral epicondylitis. Pt. Presents with R elbow joint stiffness, esp. With extension.  (+) R lat. Elbow tenderness with palpation at origin of R lateral wrist extensors.  R forearm muscle/ grip weakness noted as compared to L.  Pt. Will benefit from skilled PT services to develop HEP to decrease R elbow pain with sleep/ daily household tasks.    OBJECTIVE IMPAIRMENTS: decreased ROM, decreased strength, hypomobility, impaired flexibility, impaired UE functional use, and pain.   ACTIVITY LIMITATIONS: carrying, lifting, locomotion level, and caring for others  PARTICIPATION LIMITATIONS: community activity and occupation  PERSONAL FACTORS: Past/current experiences are also affecting patient's functional outcome.   REHAB POTENTIAL: Excellent  CLINICAL DECISION MAKING: Stable/uncomplicated  EVALUATION COMPLEXITY: Low   GOALS: Goals reviewed with patient? Yes  SHORT TERM GOALS: Target date: 01/28/23  Pt. Independent with HEP to increase R elbow extension to [facility] as compared to  L to improve pain-free mobility.   Baseline: see above Goal status: INITIAL   LONG TERM GOALS: Target date: 02/11/23  Pt. Will increase FOTO to 63 to improve pain-free mobility.   Baseline: initial 50 Goal status: INITIAL  2.  Pt. Will report no R elbow tenderness with palpation to improve pain-free mobility/ sleep.  Baseline: (+) tenderness Goal status: INITIAL  3.  Pt. Will increase R grip strength to WNL as compared to L to improve grasping/ pain-free mobility.  Baseline: see above Goal status:  INITIAL  PLAN:  PT FREQUENCY: 1-2x/week  PT DURATION: 4 weeks  PLANNED INTERVENTIONS: Therapeutic exercises, Therapeutic activity, Neuromuscular re-education, Patient/Family education, Self Care, Joint mobilization, DME instructions, Dry Needling, Electrical stimulation, Cryotherapy, Moist heat, Taping, Ionotophoresis 4mg /ml Dexamethasone, and Manual therapy  PLAN FOR NEXT SESSION: Reassess HEP  Cammie Mcgee, PT, DPT # [phone] 01/17/2023, 7:28 PM

## 2023-01-22 ENCOUNTER — Ambulatory Visit: Payer: 59 | Admitting: Physical Therapy

## 2023-01-22 DIAGNOSIS — M25521 Pain in right elbow: Secondary | ICD-10-CM

## 2023-01-22 DIAGNOSIS — M7711 Lateral epicondylitis, right elbow: Secondary | ICD-10-CM

## 2023-01-22 DIAGNOSIS — M6281 Muscle weakness (generalized): Secondary | ICD-10-CM

## 2023-01-29 ENCOUNTER — Encounter: Payer: 59 | Admitting: Physical Therapy

## 2023-01-30 ENCOUNTER — Ambulatory Visit: Payer: 59 | Admitting: Physical Therapy

## 2023-01-30 DIAGNOSIS — M6281 Muscle weakness (generalized): Secondary | ICD-10-CM

## 2023-01-30 DIAGNOSIS — M7711 Lateral epicondylitis, right elbow: Secondary | ICD-10-CM

## 2023-01-30 DIAGNOSIS — M25521 Pain in right elbow: Secondary | ICD-10-CM

## 2023-01-31 NOTE — Therapy (Addendum)
OUTPATIENT PHYSICAL THERAPY ELBOW TREATMENT   Patient Name: Alexis Casey MRN: [id number] DOB:10-23-1965, 57 y.o., female Today's Date: 01/30/23  END OF SESSION:  PT End of Session - 01/31/23 1836     Visit Number 3    Number of Visits 8    Date for PT Re-Evaluation 02/11/23    PT Start Time 1232    PT Stop Time 1310    PT Time Calculation (min) 38 min             No past medical history on file. Past Surgical History:  Procedure Laterality Date   AUGMENTATION MAMMAPLASTY Bilateral 10 years ago   REFERRING PROVIDER: Tera Partridge, PA  REFERRING DIAG: Lateral epicondylitis of right elbow  THERAPY DIAG:  Right lateral epicondylitis  Pain in right elbow  Muscle weakness (generalized)  Rationale for Evaluation and Treatment: Rehabilitation  ONSET DATE: chronic (>6 months ago).    SUBJECTIVE:                                                                                                                                                                                      SUBJECTIVE STATEMENT: Pt. Reports persistent R elbow pain in morning and during repetitive tasks.  Pt. Received cortisone injection 6 months ago and pain continues to persist.  Pt. States she sleeps with R elbow full extended at night.  Pt. States she has difficulty bending R elbow in the morning.    Hand dominance: Right  PERTINENT HISTORY: See MD note.  Pt. Well known to PT clinic.    PAIN:  Are you having pain? Yes: NPRS scale: 3/10 Pain location: R lateral elbow/distal tricep Pain description: aching Aggravating factors: elbow flexion in morning Relieving factors: rest  PRECAUTIONS: None  WEIGHT BEARING RESTRICTIONS: No  FALLS:  Has patient fallen in last 6 months? 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 R distal bicep tightness. L shoulder WNL/ elbow 0 to 160 deg.   UPPER EXTREMITY MMT:  MMT Right eval Left eval  Shoulder flexion 5 5  Shoulder extension    Shoulder abduction 5 5  Shoulder adduction  Shoulder internal rotation 5 5  Shoulder external rotation 5 5  Middle trapezius    Lower trapezius    Elbow flexion 4+ 5  Elbow extension 4 5  Wrist flexion    Wrist extension    Wrist ulnar deviation    Wrist radial deviation    Wrist pronation    Wrist supination    Grip strength (lbs) 37# 60#  (Blank rows = not tested)  SHOULDER SPECIAL TESTS: Impingement tests: Neer impingement test: negative and Hawkins/Kennedy impingement test: negative  JOINT MOBILITY TESTING:  Good R shoulder/ elbow joint mob.  PALPATION:  (+) R lat. Elbow/ distal tricep tenderness.   TODAY'S TREATMENT:                                                                                                                                         DATE: 01/30/23  Subjective:  Pt. Reports doing a little better.  Pt. Brought in R wrist cock-up splint to limited wrist extension/ allow R lat. Epi. To rest.      There.ex.:  R grip strength 51.5# (marked increase today)- no c/o elbow pain.  L 62.5#   Seated red therabar for wrist ext./flexion eccentric ex. And sup./pron.  No pain reported.   Discussed HEP/ no changes  Manual tx.:  IASTM to R proximal forearm/ distal triceps/ biceps with gentle stretches.  Use of free up massage cream to decrease friction during STM.  Supine R elbow extension static stretches (5x each)- as tolerated.   Ice to R elbow after tx.   PATIENT  EDUCATION: Education details: Access Code: [email or web address] Person educated: Patient Education method: Explanation, Demonstration, and Handouts Education comprehension: verbalized understanding and returned demonstration  HOME EXERCISE PROGRAM: Access Code: [email or web address] URL: [email or web address] Date: 01/14/2023 Prepared by: Dorene Grebe  Exercises - Standing Wrist Extension Stretch  - 2 x daily - 7 x weekly - 1 sets - 3 reps - 20 seconds hold - Standing Wrist Flexion Stretch  - 2 x daily - 7 x weekly - 1 sets - 3 reps - 20 seconds hold - Eccentric Wrist Extension with Resistance  - 1 x daily - 7 x weekly - 1 sets - 20 reps - Seated Wrist Flexion with Resistance  - 1 x daily - 7 x weekly - 1 sets - 20 reps - Forearm Supination with Resistance  - 1 x daily - 7 x weekly - 1 sets - 20 reps - Forearm Pronation with Resistance  - 1 x daily - 7 x weekly - 1 sets - 20 reps  ASSESSMENT:  CLINICAL IMPRESSION: Pt. Presents with R elbow joint stiffness, esp. With extension.  (+) R lat. Elbow tenderness with palpation at origin of R lateral wrist extensors.  Pt. Understands use of wrist splint to be worn with daily work/ household tasks.  No changes to HEP.  No c/o R elbow pain after tx.  Today.   Pt. Remains limited with full R elbow extension AROM as compared to L elbow.   Pt. Will benefit from skilled PT services to develop HEP to decrease R elbow pain with sleep/ daily household tasks.    OBJECTIVE IMPAIRMENTS: decreased ROM, decreased strength, hypomobility, impaired flexibility, impaired UE functional use, and pain.   ACTIVITY LIMITATIONS: carrying, lifting, locomotion level, and caring for others  PARTICIPATION LIMITATIONS: community activity and occupation  PERSONAL FACTORS: Past/current experiences are also affecting patient's functional outcome.   REHAB POTENTIAL: Excellent  CLINICAL DECISION MAKING: Stable/uncomplicated  EVALUATION COMPLEXITY: Low   GOALS: Goals reviewed  with patient? Yes  SHORT TERM GOALS: Target date: 01/28/23  Pt. Independent with HEP to increase R elbow extension to [facility] as compared to L to improve pain-free mobility.   Baseline: see above Goal status: INITIAL   LONG TERM GOALS: Target date: 02/11/23  Pt. Will increase FOTO to 63 to improve pain-free mobility.   Baseline: initial 50 Goal status: INITIAL  2.  Pt. Will report no R elbow tenderness with palpation to improve pain-free mobility/ sleep.  Baseline: (+) tenderness Goal status: INITIAL  3.  Pt. Will increase R grip strength to WNL as compared to L to improve grasping/ pain-free mobility.  Baseline: see above Goal status: INITIAL  PLAN:  PT FREQUENCY: 1-2x/week  PT DURATION: 4 weeks  PLANNED INTERVENTIONS: Therapeutic exercises, Therapeutic activity, Neuromuscular re-education, Patient/Family education, Self Care, Joint mobilization, DME instructions, Dry Needling, Electrical stimulation, Cryotherapy, Moist heat, Taping, Ionotophoresis 4mg /ml Dexamethasone, and Manual therapy  PLAN FOR NEXT SESSION: IASTM  Cammie Mcgee, PT, DPT # [id number] 01/31/2023, 6:38 PM

## 2023-01-31 NOTE — Therapy (Addendum)
OUTPATIENT PHYSICAL THERAPY ELBOW TREATMENT   Patient Name: Alexis Casey MRN: [id number] DOB:1966/05/05, 57 y.o., female Today's Date: 01/22/23  END OF SESSION:  PT End of Session - 01/31/23 1829     Visit Number 2    Number of Visits 8    Date for PT Re-Evaluation 02/11/23    PT Start Time 1425    PT Stop Time 1516    PT Time Calculation (min) 51 min            Past Surgical History:  Procedure Laterality Date   AUGMENTATION MAMMAPLASTY Bilateral 10 years ago   REFERRING PROVIDER: Tera Partridge, PA  REFERRING DIAG: Lateral epicondylitis of right elbow  THERAPY DIAG:  Right lateral epicondylitis  Pain in right elbow  Muscle weakness (generalized)  Rationale for Evaluation and Treatment: Rehabilitation  ONSET DATE: chronic (>6 months ago).    SUBJECTIVE:                                                                                                                                                                                      SUBJECTIVE STATEMENT: Pt. Reports persistent R elbow pain in morning and during repetitive tasks.  Pt. Received cortisone injection 6 months ago and pain continues to persist.  Pt. States she sleeps with R elbow full extended at night.  Pt. States she has difficulty bending R elbow in the morning.    Hand dominance: Right  PERTINENT HISTORY: See MD note.  Pt. Well known to PT clinic.    PAIN:  Are you having pain? Yes: NPRS scale: 3/10 Pain location: R lateral elbow/distal tricep Pain description: aching Aggravating factors: elbow flexion in morning Relieving factors: rest  PRECAUTIONS: None  WEIGHT BEARING RESTRICTIONS: No  FALLS:  Has patient fallen in last 6 months? 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 R distal bicep tightness. L shoulder WNL/ elbow 0 to 160 deg.   UPPER EXTREMITY MMT:  MMT Right eval Left eval  Shoulder flexion 5 5  Shoulder extension    Shoulder abduction 5 5  Shoulder adduction    Shoulder internal rotation 5 5  Shoulder  external rotation 5 5  Middle trapezius    Lower trapezius    Elbow flexion 4+ 5  Elbow extension 4 5  Wrist flexion    Wrist extension    Wrist ulnar deviation    Wrist radial deviation    Wrist pronation    Wrist supination    Grip strength (lbs) 37# 60#  (Blank rows = not tested)  SHOULDER SPECIAL TESTS: Impingement tests: Neer impingement test: negative and Hawkins/Kennedy impingement test: negative  JOINT MOBILITY TESTING:  Good R shoulder/ elbow joint mob.  PALPATION:  (+) R lat. Elbow/ distal tricep tenderness.   TODAY'S TREATMENT:                                                                                                                                         DATE: 01/22/23  Subjective:  Pt. Reports marked increase in R elbow pain in morning.  Pt. States she has been using ice and trying to stretch R elbow before bed.    There.ex.:  Reviewed HEP.   Seated RTB wrist extension/ flexion/ sup./ pron. 10x with proper technique.    Use of red therabar for wrist ext./flexion eccentric ex. And sup./pron.  No pain reported.   Manual tx.:  TDN to R proximal wrist extensors (2 needles 40 mm. X 0.25 with pistoning technique)- good tx. Tolerance with no issues.  No muscle fasciculations noted.    STM to R proximal forearm/ distal triceps/ biceps with gentle stretches.   Ice to R elbow after tx.   PATIENT EDUCATION: Education details:  Access Code: [email or web address] Person educated: Patient Education method: Explanation, Demonstration, and Handouts Education comprehension: verbalized understanding and returned demonstration  HOME EXERCISE PROGRAM: Access Code: [email or web address] URL: [email or web address] Date: 01/14/2023 Prepared by: Dorene Grebe  Exercises - Standing Wrist Extension Stretch  - 2 x daily - 7 x weekly - 1 sets - 3 reps - 20 seconds hold - Standing Wrist Flexion Stretch  - 2 x daily - 7 x weekly - 1 sets - 3 reps - 20 seconds hold - Eccentric Wrist Extension with Resistance  - 1 x daily - 7 x weekly - 1 sets - 20 reps - Seated Wrist Flexion with Resistance  - 1 x daily - 7 x weekly - 1 sets - 20 reps - Forearm Supination with Resistance  - 1 x daily - 7 x weekly - 1 sets - 20 reps - Forearm Pronation with Resistance  - 1 x daily - 7 x weekly - 1 sets - 20 reps  ASSESSMENT:  CLINICAL IMPRESSION: Pt. Presents with R elbow joint stiffness, esp. With extension.  (+) R lat. Elbow tenderness with palpation at origin of R lateral wrist extensors.  Good technique with stretches/ strengthening ex. With no increase c/o R elbow pain.  Pt. Remains limited with full R elbow extension AROM as compared to L elbow.  Pt. Will benefit from skilled PT services to develop HEP to decrease R elbow pain with sleep/ daily household tasks.    OBJECTIVE IMPAIRMENTS: decreased ROM, decreased strength, hypomobility, impaired flexibility, impaired UE functional use, and pain.   ACTIVITY LIMITATIONS: carrying, lifting, locomotion level, and caring for others  PARTICIPATION LIMITATIONS: community activity and occupation  PERSONAL FACTORS: Past/current experiences are also affecting patient's functional outcome.   REHAB POTENTIAL: Excellent  CLINICAL DECISION MAKING: Stable/uncomplicated  EVALUATION COMPLEXITY: Low   GOALS: Goals reviewed with patient? Yes  SHORT TERM GOALS: Target date: 01/28/23  Pt. Independent with HEP  to increase R elbow extension to [facility] as compared to L to improve pain-free mobility.   Baseline: see above Goal status: INITIAL   LONG TERM GOALS: Target date: 02/11/23  Pt. Will increase FOTO to 63 to improve pain-free mobility.   Baseline: initial 50 Goal status: INITIAL  2.  Pt. Will report no R elbow tenderness with palpation to improve pain-free mobility/ sleep.  Baseline: (+) tenderness Goal status: INITIAL  3.  Pt. Will increase R grip strength to WNL as compared to L to improve grasping/ pain-free mobility.  Baseline: see above Goal status: INITIAL  PLAN:  PT FREQUENCY: 1-2x/week  PT DURATION: 4 weeks  PLANNED INTERVENTIONS: Therapeutic exercises, Therapeutic activity, Neuromuscular re-education, Patient/Family education, Self Care, Joint mobilization, DME instructions, Dry Needling, Electrical stimulation, Cryotherapy, Moist heat, Taping, Ionotophoresis 4mg /ml Dexamethasone, and Manual therapy  PLAN FOR NEXT SESSION: IASTM  Cammie Mcgee, PT, DPT # [phone] 01/31/2023, 6:31 PM

## 2023-02-03 ENCOUNTER — Ambulatory Visit: Payer: 59 | Admitting: Physical Therapy

## 2023-02-03 DIAGNOSIS — M7711 Lateral epicondylitis, right elbow: Secondary | ICD-10-CM

## 2023-02-03 DIAGNOSIS — M25521 Pain in right elbow: Secondary | ICD-10-CM

## 2023-02-03 DIAGNOSIS — M6281 Muscle weakness (generalized): Secondary | ICD-10-CM

## 2023-02-10 ENCOUNTER — Ambulatory Visit: Payer: 59 | Admitting: Physical Therapy

## 2023-02-10 DIAGNOSIS — M7711 Lateral epicondylitis, right elbow: Secondary | ICD-10-CM

## 2023-02-10 DIAGNOSIS — M25521 Pain in right elbow: Secondary | ICD-10-CM

## 2023-02-10 DIAGNOSIS — M6281 Muscle weakness (generalized): Secondary | ICD-10-CM

## 2023-02-10 NOTE — Therapy (Addendum)
OUTPATIENT PHYSICAL THERAPY ELBOW TREATMENT   Patient Name: Alexis Casey MRN: [id number] DOB:26-Nov-1965, 57 y.o., female Today's Date: 02/03/23  END OF SESSION:  PT End of Session - 02/10/23 1811     Visit Number 4    Number of Visits 8    Date for PT Re-Evaluation 02/11/23    PT Start Time 1500    PT Stop Time 1544    PT Time Calculation (min) 44 min             No past medical history on file. Past Surgical History:  Procedure Laterality Date   AUGMENTATION MAMMAPLASTY Bilateral 10 years ago   REFERRING PROVIDER: Tera Partridge, PA   REFERRING DIAG: Lateral epicondylitis of right elbow   THERAPY DIAG:  Right lateral epicondylitis   Pain in right elbow   Muscle weakness (generalized)   Rationale for Evaluation and Treatment: Rehabilitation   ONSET DATE: chronic (>6 months ago).     SUBJECTIVE:                                                                                                                                                                                       SUBJECTIVE STATEMENT: Pt. Reports persistent R elbow pain in morning and during repetitive tasks.  Pt. Received cortisone injection 6 months ago and pain continues to persist.  Pt. States she sleeps with R elbow full extended at night.  Pt. States she has difficulty bending R elbow in the morning.     Hand dominance: Right   PERTINENT HISTORY: See MD note.  Pt. Well known to PT clinic.     PAIN:  Are you having pain? Yes: NPRS scale: 3/10 Pain location: R lateral elbow/distal tricep Pain description: aching Aggravating factors: elbow flexion in morning Relieving factors: rest   PRECAUTIONS: None   WEIGHT BEARING RESTRICTIONS: No   FALLS:  Has patient fallen in last 6 months? 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 R distal bicep tightness. L shoulder WNL/ elbow 0 to 160 deg.    UPPER EXTREMITY MMT:   MMT Right eval Left eval  Shoulder flexion 5 5  Shoulder extension      Shoulder abduction 5 5  Shoulder adduction      Shoulder internal rotation 5 5  Shoulder external rotation 5 5  Middle trapezius      Lower trapezius      Elbow flexion 4+ 5  Elbow extension 4 5  Wrist flexion      Wrist extension      Wrist ulnar deviation      Wrist radial deviation      Wrist pronation      Wrist supination      Grip strength (lbs) 37# 60#  (Blank rows = not tested)   SHOULDER SPECIAL TESTS: Impingement tests: Neer impingement test: negative and Hawkins/Kennedy impingement test: negative   JOINT MOBILITY TESTING:  Good R shoulder/ elbow joint mob.   PALPATION:  (+) R lat. Elbow/ distal tricep tenderness.  R grip strength 51.5# (marked increase today)- no c/o elbow pain.  L 62.5#        TODAY'S TREATMENT:                                                                                                                                         DATE: 02/03/23   Subjective:  Pt. Reports a decrease in overall R elbow pain.  Pt. Has been compliant with HEP/ elbow stretches and use of ice.  PT reviewed use of wrist splint.    There.ex.:   Reviewed pts. Gym ex. Program/ discussed use of dumbbells.    Seated red therabar for wrist ext./flexion eccentric ex. And sup./pron.  No pain reported.     Manual tx.:   IASTM to R proximal forearm/ distal triceps/ biceps with gentle stretches.  Use of free up massage cream to decrease  friction during STM.   Seated R elbow extension static stretches (5x each)- as tolerated.    Ice to R elbow after tx.    PATIENT EDUCATION: Education details: Access Code: [email or web address] Person educated: Patient Education method: Explanation, Demonstration, and Handouts Education comprehension: verbalized understanding and returned demonstration   HOME EXERCISE PROGRAM: Access Code: [email or web address] URL: [email or web address] Date: 01/14/2023 Prepared by: Dorene Grebe   Exercises - Standing Wrist Extension Stretch  - 2 x daily - 7 x weekly - 1 sets - 3 reps - 20 seconds hold - Standing Wrist Flexion Stretch  - 2 x daily - 7 x weekly - 1 sets - 3 reps - 20 seconds hold - Eccentric Wrist Extension with Resistance  - 1 x daily - 7 x weekly - 1 sets - 20 reps - Seated Wrist Flexion with Resistance  - 1 x daily - 7 x weekly -  1 sets - 20 reps - Forearm Supination with Resistance  - 1 x daily - 7 x weekly - 1 sets - 20 reps - Forearm Pronation with Resistance  - 1 x daily - 7 x weekly - 1 sets - 20 reps   ASSESSMENT:   CLINICAL IMPRESSION: Pt. Has minimal to no R elbow tenderness today.  Slight discomfort over R lateral epicondyle with heavy palpation.  Good understanding of strength training and use of wrist splint with household chores.   Pt. Will benefit from skilled PT services to develop HEP to decrease R elbow pain with sleep/ daily household tasks.     OBJECTIVE IMPAIRMENTS: decreased ROM, decreased strength, hypomobility, impaired flexibility, impaired UE functional use, and pain.    ACTIVITY LIMITATIONS: carrying, lifting, locomotion level, and caring for others   PARTICIPATION LIMITATIONS: community activity and occupation   PERSONAL FACTORS: Past/current experiences are also affecting patient's functional outcome.    REHAB POTENTIAL: Excellent   CLINICAL DECISION MAKING: Stable/uncomplicated   EVALUATION COMPLEXITY: Low     GOALS: Goals reviewed with patient?  Yes   SHORT TERM GOALS: Target date: 01/28/23   Pt. Independent with HEP to increase R elbow extension to [facility] as compared to L to improve pain-free mobility.   Baseline: see above Goal status: Partially met     LONG TERM GOALS: Target date: 02/11/23   Pt. Will increase FOTO to 63 to improve pain-free mobility.   Baseline: initial 50 Goal status: INITIAL   2.  Pt. Will report no R elbow tenderness with palpation to improve pain-free mobility/ sleep.  Baseline: (+) tenderness Goal status: INITIAL   3.  Pt. Will increase R grip strength to WNL as compared to L to improve grasping/ pain-free mobility.  Baseline: see above Goal status: INITIAL   PLAN:   PT FREQUENCY: 1-2x/week   PT DURATION: 4 weeks   PLANNED INTERVENTIONS: Therapeutic exercises, Therapeutic activity, Neuromuscular re-education, Patient/Family education, Self Care, Joint mobilization, DME instructions, Dry Needling, Electrical stimulation, Cryotherapy, Moist heat, Taping, Ionotophoresis 4mg /ml Dexamethasone, and Manual therapy   PLAN FOR NEXT SESSION: Instruct pts. Husband in IASTM/ check goals.   Cammie Mcgee, PT, DPT # [phone] 02/10/2023, 6:12 PM

## 2023-02-10 NOTE — Therapy (Addendum)
OUTPATIENT PHYSICAL THERAPY ELBOW TREATMENT   Patient Name: Alexis Casey MRN: [id number] DOB:1966-01-09, 57 y.o., female Today's Date: 02/10/2023  END OF SESSION:  PT End of Session - 02/10/23 2024     Visit Number 5    Number of Visits 8    Date for PT Re-Evaluation 02/11/23    PT Start Time 1433    PT Stop Time 1518    PT Time Calculation (min) 45 min             No past medical history on file. Past Surgical History:  Procedure Laterality Date   AUGMENTATION MAMMAPLASTY Bilateral 10 years ago   REFERRING PROVIDER: Tera Partridge, PA   REFERRING DIAG: Lateral epicondylitis of right elbow   THERAPY DIAG:  Right lateral epicondylitis   Pain in right elbow   Muscle weakness (generalized)   Rationale for Evaluation and Treatment: Rehabilitation   ONSET DATE: chronic (>6 months ago).     SUBJECTIVE:                                                                                                                                                                                       SUBJECTIVE STATEMENT: Pt. Reports persistent R elbow pain in morning and during repetitive tasks.  Pt. Received cortisone injection 6 months ago and pain continues to persist.  Pt. States she sleeps with R elbow full extended at night.  Pt. States she has difficulty bending R elbow in the morning.     Hand dominance: Right   PERTINENT HISTORY: See MD note.  Pt. Well known to PT clinic.     PAIN:  Are you having pain? Yes: NPRS scale: 3/10 Pain location: R lateral elbow/distal tricep Pain description: aching Aggravating factors: elbow flexion in morning Relieving factors: rest   PRECAUTIONS: None   WEIGHT BEARING RESTRICTIONS: No   FALLS:  Has patient fallen in last 6 months? 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 R distal bicep tightness. L shoulder WNL/ elbow 0 to 160 deg.    UPPER EXTREMITY MMT:   MMT Right eval Left eval  Shoulder flexion 5 5  Shoulder extension      Shoulder abduction 5 5  Shoulder adduction      Shoulder internal rotation 5 5  Shoulder external rotation 5 5  Middle trapezius      Lower trapezius      Elbow flexion 4+ 5  Elbow extension 4 5  Wrist flexion      Wrist extension      Wrist ulnar deviation      Wrist radial deviation      Wrist pronation      Wrist supination      Grip strength (lbs) 37# 60#  (Blank rows = not tested)   SHOULDER SPECIAL TESTS: Impingement tests: Neer impingement test: negative and Hawkins/Kennedy impingement test: negative   JOINT MOBILITY TESTING:  Good R shoulder/ elbow joint mob.   PALPATION:  (+) R lat. Elbow/ distal tricep tenderness.  R grip strength 51.5# (marked increase today)- no c/o elbow pain.  L 62.5#        TODAY'S TREATMENT:                                                                                                                                         DATE: 02/10/23   Subjective:  Pt. States she has been doing better since IASTM last week.  Pt. Is planning to bring husband into tx. Next week to instruct in [facility].     No there.ex. today     Manual tx.:   IASTM to R proximal forearm/ distal triceps/ biceps with gentle stretches.  Use of free up massage cream to decrease friction during STM.   Seated R elbow extension static stretches (5x each)- as tolerated.   Discussed goals   Ice to R elbow after tx.    PATIENT  EDUCATION: Education details: Access Code: [email or web address] Person educated: Patient Education method: Explanation, Demonstration, and Handouts Education comprehension: verbalized understanding and returned demonstration   HOME EXERCISE PROGRAM: Access Code: [email or web address] URL: [email or web address] Date: 01/14/2023 Prepared by: Dorene Grebe   Exercises - Standing Wrist Extension Stretch  - 2 x daily - 7 x weekly - 1 sets - 3 reps - 20 seconds hold - Standing Wrist Flexion Stretch  - 2 x daily - 7 x weekly - 1 sets - 3 reps - 20 seconds hold - Eccentric Wrist Extension with Resistance  - 1 x daily - 7 x weekly - 1 sets - 20 reps - Seated Wrist Flexion with Resistance  - 1 x daily - 7 x weekly - 1 sets - 20 reps - Forearm Supination with Resistance  - 1 x daily - 7 x weekly - 1 sets - 20 reps -  Forearm Pronation with Resistance  - 1 x daily - 7 x weekly - 1 sets - 20 reps   ASSESSMENT:   CLINICAL IMPRESSION: Pt. Has minimal to no R elbow tenderness today.  Slight discomfort over R lateral epicondyle with heavy palpation. Pt. Will bring husband to PT next week to instruct in IASTM for home management.   Pt. Will benefit from skilled PT services to develop HEP to decrease R elbow pain with sleep/ daily household tasks.     OBJECTIVE IMPAIRMENTS: decreased ROM, decreased strength, hypomobility, impaired flexibility, impaired UE functional use, and pain.    ACTIVITY LIMITATIONS: carrying, lifting, locomotion level, and caring for others   PARTICIPATION LIMITATIONS: community activity and occupation   PERSONAL FACTORS: Past/current experiences are also affecting patient's functional outcome.    REHAB POTENTIAL: Excellent   CLINICAL DECISION MAKING: Stable/uncomplicated   EVALUATION COMPLEXITY: Low     GOALS: Goals reviewed with patient? Yes   SHORT TERM GOALS: Target date: 01/28/23   Pt. Independent with HEP to increase R elbow extension to [facility] as compared to L to improve  pain-free mobility.   Baseline: see above Goal status: Partially met     LONG TERM GOALS: Target date: 02/11/23   Pt. Will increase FOTO to 63 to improve pain-free mobility.   Baseline: initial 50 Goal status: INITIAL   2.  Pt. Will report no R elbow tenderness with palpation to improve pain-free mobility/ sleep.  Baseline: (+) tenderness Goal status: Partially met   3.  Pt. Will increase R grip strength to WNL as compared to L to improve grasping/ pain-free mobility.  Baseline: see above Goal status: Partially met   PLAN:   PT FREQUENCY: 1-2x/week   PT DURATION: 4 weeks   PLANNED INTERVENTIONS: Therapeutic exercises, Therapeutic activity, Neuromuscular re-education, Patient/Family education, Self Care, Joint mobilization, DME instructions, Dry Needling, Electrical stimulation, Cryotherapy, Moist heat, Taping, Ionotophoresis 4mg /ml Dexamethasone, and Manual therapy   PLAN FOR NEXT SESSION: Instruct pts. Husband in IASTM  Cammie Mcgee, [place], DPT # [phone] 02/10/2023, 8:25 PM

## 2023-02-12 ENCOUNTER — Ambulatory Visit: Payer: 59 | Admitting: Physical Therapy

## 2023-02-17 ENCOUNTER — Encounter: Payer: 59 | Admitting: Physical Therapy

## 2023-02-19 ENCOUNTER — Ambulatory Visit: Payer: 59 | Attending: Physician Assistant | Admitting: Physical Therapy

## 2023-02-19 DIAGNOSIS — M25521 Pain in right elbow: Secondary | ICD-10-CM

## 2023-02-19 DIAGNOSIS — M6281 Muscle weakness (generalized): Secondary | ICD-10-CM | POA: Insufficient documentation

## 2023-02-19 DIAGNOSIS — M7711 Lateral epicondylitis, right elbow: Secondary | ICD-10-CM | POA: Insufficient documentation

## 2023-02-24 ENCOUNTER — Encounter: Payer: 59 | Admitting: Physical Therapy

## 2023-02-26 ENCOUNTER — Ambulatory Visit: Payer: 59 | Admitting: Physical Therapy

## 2023-02-26 DIAGNOSIS — M6281 Muscle weakness (generalized): Secondary | ICD-10-CM

## 2023-02-26 DIAGNOSIS — M25521 Pain in right elbow: Secondary | ICD-10-CM

## 2023-02-26 DIAGNOSIS — M7711 Lateral epicondylitis, right elbow: Secondary | ICD-10-CM

## 2023-03-15 NOTE — Therapy (Signed)
OUTPATIENT PHYSICAL THERAPY ELBOW TREATMENT/ DISCHARGE   Patient Name: Alexis Casey MRN: [id number] DOB:04-27-1966, 57 y.o., female Today's Date: 02/26/23  END OF SESSION:  PT End of Session - 03/15/23 2047     Visit Number 5    Number of Visits 8    Date for PT Re-Evaluation 02/11/23    PT Start Time 1425    PT Stop Time 1449    PT Time Calculation (min) 24 min             No past medical history on file. Past Surgical History:  Procedure Laterality Date   AUGMENTATION MAMMAPLASTY Bilateral 10 years ago    REFERRING PROVIDER: Tera Partridge, PA   REFERRING DIAG: Lateral epicondylitis of right elbow   THERAPY DIAG:  Right lateral epicondylitis   Pain in right elbow   Muscle weakness (generalized)   Rationale for Evaluation and Treatment: Rehabilitation   ONSET DATE: chronic (>6 months ago).     SUBJECTIVE:                                                                                                                                                                                       SUBJECTIVE STATEMENT: Pt. Reports persistent R elbow pain in morning and during repetitive tasks.  Pt. Received cortisone injection 6 months ago and pain continues to persist.  Pt. States she sleeps with R elbow full extended at night.  Pt. States she has difficulty bending R elbow in the morning.     Hand dominance: Right   PERTINENT HISTORY: See MD note.  Pt. Well known to PT clinic.     PAIN:  Are you having pain? Yes: NPRS scale: 3/10 Pain location: R lateral elbow/distal tricep Pain description: aching Aggravating factors: elbow flexion in morning Relieving factors: rest   PRECAUTIONS: None   WEIGHT BEARING RESTRICTIONS: No   FALLS:  Has patient fallen in last 6 months? 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 R distal bicep tightness. L shoulder WNL/ elbow 0 to 160 deg.    UPPER EXTREMITY MMT:   MMT Right eval Left eval  Shoulder flexion 5 5  Shoulder extension      Shoulder abduction 5 5  Shoulder adduction      Shoulder internal rotation 5 5  Shoulder external rotation 5 5  Middle trapezius      Lower trapezius      Elbow flexion 4+ 5  Elbow extension 4 5  Wrist flexion      Wrist extension      Wrist ulnar deviation      Wrist radial deviation      Wrist pronation      Wrist supination      Grip strength (lbs) 37# 60#  (Blank rows = not tested)   SHOULDER SPECIAL TESTS: Impingement tests: Neer impingement test: negative and Hawkins/Kennedy impingement test: negative   JOINT MOBILITY TESTING:  Good R shoulder/ elbow joint mob.   PALPATION:  (+) R lat. Elbow/ distal tricep tenderness.   R grip strength 51.5# (marked increase today)- no c/o elbow pain.  L 62.5#        TODAY'S TREATMENT:                                                                                                                                         DATE: 02/26/23   Pt. Arrived to PT with no c/o R elbow pain.  Pt. States she is doing better and understands HEP.  PT goals met.  Pt. Instructed to contact PT if any regression in R elbow pain or questions.  Discharge from PT today.  No charge.      PATIENT EDUCATION: Education details: Access Code: [email or web address] Person educated: Patient Education method: Explanation, Demonstration, and Handouts Education comprehension: verbalized understanding and returned  demonstration   HOME EXERCISE PROGRAM: Access Code: [email or web address] URL: [email or web address] Date: 01/14/2023 Prepared by: Dorene Grebe   Exercises - Standing Wrist Extension Stretch  - 2 x daily - 7 x weekly - 1 sets - 3 reps - 20 seconds hold - Standing Wrist Flexion Stretch  - 2 x daily - 7 x weekly - 1 sets - 3 reps - 20 seconds hold - Eccentric Wrist Extension with Resistance  - 1 x daily - 7 x weekly - 1 sets - 20 reps - Seated Wrist Flexion with Resistance  - 1 x daily - 7 x weekly - 1 sets - 20 reps - Forearm Supination with Resistance  - 1 x daily - 7 x weekly - 1 sets - 20 reps - Forearm Pronation with Resistance  - 1 x daily - 7 x weekly - 1 sets - 20 reps   ASSESSMENT:   CLINICAL IMPRESSION: Pt. Arrived to PT with no c/o R elbow pain.  Pt. States  she is doing better and understands HEP.  PT goals met.  Pt. Instructed to contact PT if any regression in R elbow pain or questions.  Discharge from PT today.  No charge.   OBJECTIVE IMPAIRMENTS: decreased ROM, decreased strength, hypomobility, impaired flexibility, impaired UE functional use, and pain.    ACTIVITY LIMITATIONS: carrying, lifting, locomotion level, and caring for others   PARTICIPATION LIMITATIONS: community activity and occupation   PERSONAL FACTORS: Past/current experiences are also affecting patient's functional outcome.    REHAB POTENTIAL: Excellent   CLINICAL DECISION MAKING: Stable/uncomplicated   EVALUATION COMPLEXITY: Low     GOALS: Goals reviewed with patient? Yes   SHORT TERM GOALS: Target date: 01/28/23   Pt. Independent with HEP to increase R elbow extension to [facility] as compared to L to improve pain-free mobility.   Baseline: see above Goal status: Goal met     LONG TERM GOALS: Target date: 02/11/23   Pt. Will increase FOTO to 63 to improve pain-free mobility.   Baseline: initial 50.  5/16: 74 Goal status: Goal met   2.  Pt. Will report no R elbow tenderness with palpation to  improve pain-free mobility/ sleep.  Baseline: (+) tenderness Goal status: Goal met   3.  Pt. Will increase R grip strength to WNL as compared to L to improve grasping/ pain-free mobility.  Baseline: see above Goal status: Partially met   PLAN:   PT FREQUENCY: 1-2x/week   PT DURATION: 4 weeks   PLANNED INTERVENTIONS: Therapeutic exercises, Therapeutic activity, Neuromuscular re-education, Patient/Family education, Self Care, Joint mobilization, DME instructions, Dry Needling, Electrical stimulation, Cryotherapy, Moist heat, Taping, Ionotophoresis 4mg /ml Dexamethasone, and Manual therapy   PLAN FOR NEXT SESSION: Discharge visit.    Cammie Mcgee, PT, DPT # [phone] 03/15/2023, 8:48 PM

## 2023-03-15 NOTE — Therapy (Signed)
OUTPATIENT PHYSICAL THERAPY ELBOW TREATMENT   Patient Name: Alexis Casey MRN: [id number] DOB:[birth year], 57 y.o., female Today's Date: 02/19/23  END OF SESSION:  PT End of Session - 03/15/23 2039     Visit Number 5    Number of Visits 8    Date for PT Re-Evaluation 02/11/23    PT Start Time 1430    PT Stop Time 1455    PT Time Calculation (min) 25 min             No past medical history on file. Past Surgical History:  Procedure Laterality Date   AUGMENTATION MAMMAPLASTY Bilateral 10 years ago    REFERRING PROVIDER: Tera Partridge, PA   REFERRING DIAG: Lateral epicondylitis of right elbow   THERAPY DIAG:  Right lateral epicondylitis   Pain in right elbow   Muscle weakness (generalized)   Rationale for Evaluation and Treatment: Rehabilitation   ONSET DATE: chronic (>6 months ago).     SUBJECTIVE:                                                                                                                                                                                       SUBJECTIVE STATEMENT: Pt. Reports persistent R elbow pain in morning and during repetitive tasks.  Pt. Received cortisone injection 6 months ago and pain continues to persist.  Pt. States she sleeps with R elbow full extended at night.  Pt. States she has difficulty bending R elbow in the morning.     Hand dominance: Right   PERTINENT HISTORY: See MD note.  Pt. Well known to PT clinic.     PAIN:  Are you having pain? Yes: NPRS scale: 3/10 Pain location: R lateral elbow/distal tricep Pain description: aching Aggravating factors: elbow flexion in morning Relieving factors: rest   PRECAUTIONS: None   WEIGHT BEARING RESTRICTIONS: No   FALLS:  Has patient fallen in last 6 months? 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 R distal bicep tightness. L shoulder WNL/ elbow 0 to 160 deg.    UPPER EXTREMITY MMT:   MMT Right eval Left eval  Shoulder flexion 5 5  Shoulder extension      Shoulder abduction 5 5  Shoulder adduction      Shoulder internal rotation 5 5  Shoulder external rotation 5 5  Middle trapezius      Lower trapezius      Elbow flexion 4+ 5  Elbow extension 4 5  Wrist flexion      Wrist extension      Wrist ulnar deviation      Wrist radial deviation      Wrist pronation      Wrist supination      Grip strength (lbs) 37# 60#  (Blank rows = not tested)   SHOULDER SPECIAL TESTS: Impingement tests: Neer impingement test: negative and Hawkins/Kennedy impingement test: negative   JOINT MOBILITY TESTING:  Good R shoulder/ elbow joint mob.   PALPATION:  (+) R lat. Elbow/ distal tricep tenderness.   R grip strength 51.5# (marked increase today)- no c/o elbow pain.  L 62.5#        TODAY'S TREATMENT:                                                                                                                                         DATE: 02/19/23   Pt. Brought husband to clinic today to instruct in use of IASTM to manage elbow symptoms at home.  Pt./ husband demonstrate good understanding/ technique.  No charge for visit today.      PATIENT EDUCATION: Education details: Access Code: [email or web address] Person educated: Patient Education method: Explanation, Demonstration, and Handouts Education comprehension: verbalized understanding and returned demonstration   HOME EXERCISE PROGRAM: Access Code: [email or web address] URL:  [email or web address] Date: 01/14/2023 Prepared by: Dorene Grebe   Exercises - Standing Wrist Extension Stretch  - 2 x daily - 7 x weekly - 1 sets - 3 reps - 20 seconds hold - Standing Wrist Flexion Stretch  - 2 x daily - 7 x weekly - 1 sets - 3 reps - 20 seconds hold - Eccentric Wrist Extension with Resistance  - 1 x daily - 7 x weekly - 1 sets - 20 reps - Seated Wrist Flexion with Resistance  - 1 x daily - 7 x weekly - 1 sets - 20 reps - Forearm Supination with Resistance  - 1 x daily - 7 x weekly - 1 sets - 20 reps - Forearm Pronation with Resistance  - 1 x daily - 7 x weekly - 1 sets - 20 reps   ASSESSMENT:   CLINICAL IMPRESSION: No charge for visit.  Pts. Husband instructed in proper technique of IASTM.  No pain reported today.  Pt. Will benefit from skilled PT services to develop HEP  to decrease R elbow pain with sleep/ daily household tasks.     OBJECTIVE IMPAIRMENTS: decreased ROM, decreased strength, hypomobility, impaired flexibility, impaired UE functional use, and pain.    ACTIVITY LIMITATIONS: carrying, lifting, locomotion level, and caring for others   PARTICIPATION LIMITATIONS: community activity and occupation   PERSONAL FACTORS: Past/current experiences are also affecting patient's functional outcome.    REHAB POTENTIAL: Excellent   CLINICAL DECISION MAKING: Stable/uncomplicated   EVALUATION COMPLEXITY: Low     GOALS: Goals reviewed with patient? Yes   SHORT TERM GOALS: Target date: 01/28/23   Pt. Independent with HEP to increase R elbow extension to [facility] as compared to L to improve pain-free mobility.   Baseline: see above Goal status: Partially met     LONG TERM GOALS: Target date: 02/11/23   Pt. Will increase FOTO to 63 to improve pain-free mobility.   Baseline: initial 50 Goal status: INITIAL   2.  Pt. Will report no R elbow tenderness with palpation to improve pain-free mobility/ sleep.  Baseline: (+) tenderness Goal status: Partially  met   3.  Pt. Will increase R grip strength to WNL as compared to L to improve grasping/ pain-free mobility.  Baseline: see above Goal status: Partially met   PLAN:   PT FREQUENCY: 1-2x/week   PT DURATION: 4 weeks   PLANNED INTERVENTIONS: Therapeutic exercises, Therapeutic activity, Neuromuscular re-education, Patient/Family education, Self Care, Joint mobilization, DME instructions, Dry Needling, Electrical stimulation, Cryotherapy, Moist heat, Taping, Ionotophoresis 4mg /ml Dexamethasone, and Manual therapy   PLAN FOR NEXT SESSION: Pt. Has 1 more tx. Prior to vacation.  Discharge next tx.    Cammie Mcgee, PT, DPT # [id number] 02/28/2023, 6:21 PM

## 2023-03-30 ENCOUNTER — Other Ambulatory Visit: Payer: Self-pay

## 2023-03-30 DIAGNOSIS — Z1231 Encounter for screening mammogram for malignant neoplasm of breast: Secondary | ICD-10-CM

## 2023-04-27 ENCOUNTER — Other Ambulatory Visit: Payer: Self-pay | Admitting: Infectious Diseases

## 2023-04-27 DIAGNOSIS — Z1231 Encounter for screening mammogram for malignant neoplasm of breast: Secondary | ICD-10-CM

## 2023-05-26 ENCOUNTER — Ambulatory Visit: Payer: 59
